# Patient Record
Sex: Female | Born: 1975 | Race: Black or African American | Hispanic: No | Marital: Single | Smoking: Never smoker
Health system: Southern US, Community
[De-identification: ages and names within clinical notes are randomized; demographics above are authoritative.]

## PROBLEM LIST (undated history)

## (undated) DIAGNOSIS — D649 Anemia, unspecified: Secondary | ICD-10-CM

## (undated) DIAGNOSIS — T7840XA Allergy, unspecified, initial encounter: Secondary | ICD-10-CM

## (undated) DIAGNOSIS — R87629 Unspecified abnormal cytological findings in specimens from vagina: Secondary | ICD-10-CM

## (undated) DIAGNOSIS — R87619 Unspecified abnormal cytological findings in specimens from cervix uteri: Secondary | ICD-10-CM

## (undated) DIAGNOSIS — IMO0002 Reserved for concepts with insufficient information to code with codable children: Secondary | ICD-10-CM

## (undated) HISTORY — DX: Unspecified abnormal cytological findings in specimens from cervix uteri: R87.619

## (undated) HISTORY — DX: Anemia, unspecified: D64.9

## (undated) HISTORY — PX: BIOPSY BREAST: PRO8

## (undated) HISTORY — DX: Reserved for concepts with insufficient information to code with codable children: IMO0002

## (undated) HISTORY — DX: Allergy, unspecified, initial encounter: T78.40XA

## (undated) HISTORY — PX: COLPOSCOPY: SHX161

## (undated) HISTORY — PX: WISDOM TOOTH EXTRACTION: SHX21

---

## 2005-08-24 ENCOUNTER — Emergency Department (HOSPITAL_COMMUNITY): Admission: EM | Admit: 2005-08-24 | Discharge: 2005-08-24 | Payer: Self-pay | Admitting: Emergency Medicine

## 2007-09-18 ENCOUNTER — Emergency Department: Payer: Self-pay | Admitting: Emergency Medicine

## 2011-05-08 NOTE — L&D Delivery Note (Signed)
Delivery Note At 9:54 AM a viable and healthy female was delivered via Vaginal, Spontaneous Delivery (Presentation: Left Occiput Anterior).  APGAR: 8, 9; weight pending.   Placenta status: Intact, Spontaneous.  Cord: 3 vessels with the following complications: None.    Anesthesia: None  Episiotomy: None Lacerations: 1st degree Suture Repair: 3.0 vicryl with Dr. Marice Potter Est. Blood Loss (mL): <300  Mom to postpartum.  Baby to nursery-stable. Mom plans to attempt to breastfeed; planning depo shot for birth control, appointment made 02/14/12 at 1:30 pm. Simone Curia 02/01/2012, 10:31 AM

## 2011-05-08 NOTE — L&D Delivery Note (Signed)
I was present for delivery and agree with above.  Tela Kotecki, MD 

## 2011-06-27 ENCOUNTER — Ambulatory Visit: Payer: BC Managed Care – PPO | Admitting: Gynecology

## 2011-06-27 VITALS — BP 132/61 | Wt 139.0 lb

## 2011-06-27 DIAGNOSIS — Z3480 Encounter for supervision of other normal pregnancy, unspecified trimester: Secondary | ICD-10-CM

## 2011-06-27 DIAGNOSIS — B373 Candidiasis of vulva and vagina: Secondary | ICD-10-CM

## 2011-06-27 LAB — POCT URINE PREGNANCY: Preg Test, Ur: POSITIVE

## 2011-06-27 MED ORDER — FLUCONAZOLE 150 MG PO TABS: 150.0000 mg | ORAL_TABLET | Freq: Once | ORAL | Status: AC

## 2011-06-28 ENCOUNTER — Encounter: Payer: Self-pay | Admitting: Family Medicine

## 2011-06-28 DIAGNOSIS — O9989 Other specified diseases and conditions complicating pregnancy, childbirth and the puerperium: Secondary | ICD-10-CM

## 2011-06-28 DIAGNOSIS — Z283 Underimmunization status: Secondary | ICD-10-CM | POA: Insufficient documentation

## 2011-06-28 DIAGNOSIS — Z2839 Other underimmunization status: Secondary | ICD-10-CM | POA: Insufficient documentation

## 2011-06-28 LAB — OBSTETRIC PANEL
Eosinophils Absolute: 0.1 10*3/uL (ref 0.0–0.7)
Eosinophils Relative: 2 % (ref 0–5)
HCT: 37.1 % (ref 36.0–46.0)
Hemoglobin: 11.5 g/dL — ABNORMAL LOW (ref 12.0–15.0)
Hepatitis B Surface Ag: NEGATIVE
Lymphocytes Relative: 24 % (ref 12–46)
MCHC: 31 g/dL (ref 30.0–36.0)
MCV: 83.7 fL (ref 78.0–100.0)
Monocytes Relative: 9 % (ref 3–12)
Platelets: 240 10*3/uL (ref 150–400)
Rh Type: POSITIVE
Rubella: 4.5 IU/mL

## 2011-06-28 LAB — HEPATITIS C ANTIBODY: HCV Ab: NEGATIVE

## 2011-06-29 LAB — CULTURE, URINE COMPREHENSIVE: Organism ID, Bacteria: NO GROWTH

## 2011-07-04 ENCOUNTER — Ambulatory Visit (HOSPITAL_COMMUNITY)
Admission: RE | Admit: 2011-07-04 | Discharge: 2011-07-04 | Disposition: A | Discharge status: Home / Self Care | Payer: BC Managed Care – PPO | Source: Ambulatory Visit | Attending: Family Medicine | Admitting: Family Medicine

## 2011-07-04 DIAGNOSIS — Z3689 Encounter for other specified antenatal screening: Secondary | ICD-10-CM | POA: Insufficient documentation

## 2011-07-04 DIAGNOSIS — O09529 Supervision of elderly multigravida, unspecified trimester: Secondary | ICD-10-CM | POA: Insufficient documentation

## 2011-07-04 DIAGNOSIS — Z3480 Encounter for supervision of other normal pregnancy, unspecified trimester: Secondary | ICD-10-CM

## 2011-07-09 ENCOUNTER — Ambulatory Visit (INDEPENDENT_AMBULATORY_CARE_PROVIDER_SITE_OTHER): Payer: BC Managed Care – PPO | Admitting: Obstetrics & Gynecology

## 2011-07-09 ENCOUNTER — Other Ambulatory Visit: Payer: Self-pay | Admitting: Obstetrics & Gynecology

## 2011-07-09 VITALS — BP 114/57 | Wt 138.0 lb

## 2011-07-09 DIAGNOSIS — Z23 Encounter for immunization: Secondary | ICD-10-CM

## 2011-07-09 DIAGNOSIS — Z369 Encounter for antenatal screening, unspecified: Secondary | ICD-10-CM

## 2011-07-09 DIAGNOSIS — Z3480 Encounter for supervision of other normal pregnancy, unspecified trimester: Secondary | ICD-10-CM

## 2011-07-09 DIAGNOSIS — Z113 Encounter for screening for infections with a predominantly sexual mode of transmission: Secondary | ICD-10-CM

## 2011-07-09 DIAGNOSIS — Z1272 Encounter for screening for malignant neoplasm of vagina: Secondary | ICD-10-CM

## 2011-07-09 NOTE — Progress Notes (Signed)
   Subjective:    Lisa Landry is a G2P1001 [redacted]w[redacted]d being seen today for her first obstetrical visit.  Her obstetrical history is significant for advanced maternal age. Patient does intend to breast feed. Pregnancy history fully reviewed.  Patient reports no complaints.  Filed Vitals:   07/09/11 1500  BP: 114/57  Weight: 138 lb (62.596 kg)    HISTORY: OB History    Grav Para Term Preterm Abortions TAB SAB Ect Mult Living   2 1 1       1      # Outc Date GA Lbr Len/2nd Wgt Sex Del Anes PTL Lv   1 TRM 1998 [redacted]w[redacted]d   F SVD None  Yes   2 CUR              Past Medical History  Diagnosis Date  . Abnormal Pap smear    Past Surgical History  Procedure Date  . Colposcopy     abnormal pap.  . Wisdom tooth extraction     x2   Family History  Problem Relation Age of Onset  . Hypothyroidism Mother   . Hypothyroidism Maternal Aunt      Exam    Uterine Size: size equals dates  Pelvic Exam:    Perineum: No Hemorrhoids   Vulva: normal   Vagina:  normal mucosa   pH:    Cervix: anteverted   Adnexa: normal adnexa   Bony Pelvis: android  System: Breast:  normal appearance, no masses or tenderness   Skin: normal coloration and turgor, no rashes    Neurologic: oriented   Extremities: normal strength, tone, and muscle mass   HEENT PERRLA   Mouth/Teeth mucous membranes moist, pharynx normal without lesions   Neck supple   Cardiovascular: regular rate and rhythm   Respiratory:  appears well, vitals normal, no respiratory distress, acyanotic, normal RR, ear and throat exam is normal, neck free of mass or lymphadenopathy, chest clear, no wheezing, crepitations, rhonchi, normal symmetric air entry   Abdomen: soft, non-tender; bowel sounds normal; no masses,  no organomegaly   Urinary: urethral meatus normal      Assessment:    Pregnancy: G2P1001 Patient Active Problem List  Diagnoses  . Rubella non-immune status, antepartum        Plan:     Initial labs  drawn. Prenatal vitamins. Problem list reviewed and updated. Genetic Screening discussed First Screen: requested.  Ultrasound discussed; fetal survey: requested.  Follow up in 4 weeks. % of  min visit spent on counseling and coordination of care.     Birch Farino C. 07/09/2011

## 2011-07-09 NOTE — Patient Instructions (Signed)
Pregnancy - First Trimester  During sexual intercourse, millions of sperm go into the vagina. Only 1 sperm will penetrate and fertilize the female egg while it is in the Fallopian tube. One week later, the fertilized egg implants into the wall of the uterus. An embryo begins to develop into a baby. At 6 to 8 weeks, the eyes and face are formed and the heartbeat can be seen on ultrasound. At the end of 12 weeks (first trimester), all the baby's organs are formed. Now that you are pregnant, you will want to do everything you can to have a healthy baby. Two of the most important things are to get good prenatal care and follow your caregiver's instructions. Prenatal care is all the medical care you receive before the baby's birth. It is given to prevent, find, and treat problems during the pregnancy and childbirth.  PRENATAL EXAMS   During prenatal visits, your weight, blood pressure and urine are checked. This is done to make sure you are healthy and progressing normally during the pregnancy.   A pregnant woman should gain 25 to 35 pounds during the pregnancy. However, if you are over weight or underweight, your caregiver will advise you regarding your weight.   Your caregiver will ask and answer questions for you.   Blood work, cervical cultures, other necessary tests and a Pap test are done during your prenatal exams. These tests are done to check on your health and the probable health of your baby. Tests are strongly recommended and done for HIV with your permission. This is the virus that causes AIDS. These tests are done because medications can be given to help prevent your baby from being born with this infection should you have been infected without knowing it. Blood work is also used to find out your blood type, previous infections and follow your blood levels (hemoglobin).   Low hemoglobin (anemia) is common during pregnancy. Iron and vitamins are given to help prevent this. Later in the pregnancy, blood  tests for diabetes will be done along with any other tests if any problems develop. You may need tests to make sure you and the baby are doing well.   You may need other tests to make sure you and the baby are doing well.  CHANGES DURING THE FIRST TRIMESTER (THE FIRST 3 MONTHS OF PREGNANCY)  Your body goes through many changes during pregnancy. They vary from person to person. Talk to your caregiver about changes you notice and are concerned about. Changes can include:   Your menstrual period stops.   The egg and sperm carry the genes that determine what you look like. Genes from you and your partner are forming a baby. The female genes determine whether the baby is a boy or a girl.   Your body increases in girth and you may feel bloated.   Feeling sick to your stomach (nauseous) and throwing up (vomiting). If the vomiting is uncontrollable, call your caregiver.   Your breasts will begin to enlarge and become tender.   Your nipples may stick out more and become darker.   The need to urinate more. Painful urination may mean you have a bladder infection.   Tiring easily.   Loss of appetite.   Cravings for certain kinds of food.   At first, you may gain or lose a couple of pounds.   You may have changes in your emotions from day to day (excited to be pregnant or concerned something may go wrong with   the pregnancy and baby).   You may have more vivid and strange dreams.  HOME CARE INSTRUCTIONS    It is very important to avoid all smoking, alcohol and un-prescribed drugs during your pregnancy. These affect the formation and growth of the baby. Avoid chemicals while pregnant to ensure the delivery of a healthy infant.   Start your prenatal visits by the 12th week of pregnancy. They are usually scheduled monthly at first, then more often in the last 2 months before delivery. Keep your caregiver's appointments. Follow your caregiver's instructions regarding medication use, blood and lab tests, exercise, and  diet.   During pregnancy, you are providing food for you and your baby. Eat regular, well-balanced meals. Choose foods such as meat, fish, milk and other low fat dairy products, vegetables, fruits, and whole-grain breads and cereals. Your caregiver will tell you of the ideal weight gain.   You can help morning sickness by keeping soda crackers at the bedside. Eat a couple before arising in the morning. You may want to use the crackers without salt on them.   Eating 4 to 5 small meals rather than 3 large meals a day also may help the nausea and vomiting.   Drinking liquids between meals instead of during meals also seems to help nausea and vomiting.   A physical sexual relationship may be continued throughout pregnancy if there are no other problems. Problems may be early (premature) leaking of amniotic fluid from the membranes, vaginal bleeding, or belly (abdominal) pain.   Exercise regularly if there are no restrictions. Check with your caregiver or physical therapist if you are unsure of the safety of some of your exercises. Greater weight gain will occur in the last 2 trimesters of pregnancy. Exercising will help:   Control your weight.   Keep you in shape.   Prepare you for labor and delivery.   Help you lose your pregnancy weight after you deliver your baby.   Wear a good support or jogging bra for breast tenderness during pregnancy. This may help if worn during sleep too.   Ask when prenatal classes are available. Begin classes when they are offered.   Do not use hot tubs, steam rooms or saunas.   Wear your seat belt when driving. This protects you and your baby if you are in an accident.   Avoid raw meat, uncooked cheese, cat litter boxes and soil used by cats throughout the pregnancy. These carry germs that can cause birth defects in the baby.   The first trimester is a good time to visit your dentist for your dental health. Getting your teeth cleaned is OK. Use a softer toothbrush and brush  gently during pregnancy.   Ask for help if you have financial, counseling or nutritional needs during pregnancy. Your caregiver will be able to offer counseling for these needs as well as refer you for other special needs.   Do not take any medications or herbs unless told by your caregiver.   Inform your caregiver if there is any mental or physical domestic violence.   Make a list of emergency phone numbers of family, friends, hospital, and police and fire departments.   Write down your questions. Take them to your prenatal visit.   Do not douche.   Do not cross your legs.   If you have to stand for long periods of time, rotate you feet or take small steps in a circle.   You may have more vaginal secretions that may   require a sanitary pad. Do not use tampons or scented sanitary pads.  MEDICATIONS AND DRUG USE IN PREGNANCY   Take prenatal vitamins as directed. The vitamin should contain 1 milligram of folic acid. Keep all vitamins out of reach of children. Only a couple vitamins or tablets containing iron may be fatal to a baby or young child when ingested.   Avoid use of all medications, including herbs, over-the-counter medications, not prescribed or suggested by your caregiver. Only take over-the-counter or prescription medicines for pain, discomfort, or fever as directed by your caregiver. Do not use aspirin, ibuprofen, or naproxen unless directed by your caregiver.   Let your caregiver also know about herbs you may be using.   Alcohol is related to a number of birth defects. This includes fetal alcohol syndrome. All alcohol, in any form, should be avoided completely. Smoking will cause low birth rate and premature babies.   Street or illegal drugs are very harmful to the baby. They are absolutely forbidden. A baby born to an addicted mother will be addicted at birth. The baby will go through the same withdrawal an adult does.   Let your caregiver know about any medications that you have to take  and for what reason you take them.  MISCARRIAGE IS COMMON DURING PREGNANCY  A miscarriage does not mean you did something wrong. It is not a reason to worry about getting pregnant again. Your caregiver will help you with questions you may have. If you have a miscarriage, you may need minor surgery.  SEEK MEDICAL CARE IF:   You have any concerns or worries during your pregnancy. It is better to call with your questions if you feel they cannot wait, rather than worry about them.  SEEK IMMEDIATE MEDICAL CARE IF:    An unexplained oral temperature above 102 F (38.9 C) develops, or as your caregiver suggests.   You have leaking of fluid from the vagina (birth canal). If leaking membranes are suspected, take your temperature and inform your caregiver of this when you call.   There is vaginal spotting or bleeding. Notify your caregiver of the amount and how many pads are used.   You develop a bad smelling vaginal discharge with a change in the color.   You continue to feel sick to your stomach (nauseated) and have no relief from remedies suggested. You vomit blood or coffee ground-like materials.   You lose more than 2 pounds of weight in 1 week.   You gain more than 2 pounds of weight in 1 week and you notice swelling of your face, hands, feet, or legs.   You gain 5 pounds or more in 1 week (even if you do not have swelling of your hands, face, legs, or feet).   You get exposed to German measles and have never had them.   You are exposed to fifth disease or chickenpox.   You develop belly (abdominal) pain. Round ligament discomfort is a common non-cancerous (benign) cause of abdominal pain in pregnancy. Your caregiver still must evaluate this.   You develop headache, fever, diarrhea, pain with urination, or shortness of breath.   You fall or are in a car accident or have any kind of trauma.   There is mental or physical violence in your home.  Document Released: 04/17/2001 Document Revised: 04/12/2011  Document Reviewed: 10/19/2008  [employer] Patient Information 2012 [employer].

## 2011-07-24 ENCOUNTER — Other Ambulatory Visit: Payer: Self-pay

## 2011-07-24 ENCOUNTER — Ambulatory Visit (HOSPITAL_COMMUNITY)
Admission: RE | Admit: 2011-07-24 | Discharge: 2011-07-24 | Disposition: A | Discharge status: Home / Self Care | Payer: BC Managed Care – PPO | Source: Ambulatory Visit | Attending: Obstetrics & Gynecology | Admitting: Obstetrics & Gynecology

## 2011-07-24 DIAGNOSIS — O09529 Supervision of elderly multigravida, unspecified trimester: Secondary | ICD-10-CM | POA: Insufficient documentation

## 2011-07-24 DIAGNOSIS — Z369 Encounter for antenatal screening, unspecified: Secondary | ICD-10-CM

## 2011-08-07 ENCOUNTER — Ambulatory Visit (INDEPENDENT_AMBULATORY_CARE_PROVIDER_SITE_OTHER): Payer: BC Managed Care – PPO | Admitting: Obstetrics & Gynecology

## 2011-08-07 DIAGNOSIS — Z3480 Encounter for supervision of other normal pregnancy, unspecified trimester: Secondary | ICD-10-CM

## 2011-08-07 NOTE — Patient Instructions (Signed)
Breastfeeding BENEFITS OF BREASTFEEDING For the baby  The first milk (colostrum) helps the baby's digestive system function better.   There are antibodies from the mother in the milk that help the baby fight off infections.   The baby has a lower incidence of asthma, allergies, and SIDS (sudden infant death syndrome).   The nutrients in breast milk are better than formulas for the baby and helps the baby's brain grow better.   Babies who breastfeed have less gas, colic, and constipation.  For the mother  Breastfeeding helps develop a very special bond between mother and baby.   It is more convenient, always available at the correct temperature and cheaper than formula feeding.   It burns calories in the mother and helps with losing weight that was gained during pregnancy.   It makes the uterus contract back down to normal size faster and slows bleeding following delivery.   Breastfeeding mothers have a lower risk of developing breast cancer.  NURSE FREQUENTLY  A healthy, full-term baby may breastfeed as often as every hour or space his or her feedings to every 3 hours.   How often to nurse will vary from baby to baby. Watch your baby for signs of hunger, not the clock.   Nurse as often as the baby requests, or when you feel the need to reduce the fullness of your breasts.   Awaken the baby if it has been 3 to 4 hours since the last feeding.   Frequent feeding will help the mother make more milk and will prevent problems like sore nipples and engorgement of the breasts.  BABY'S POSITION AT THE BREAST  Whether lying down or sitting, be sure that the baby's tummy is facing your tummy.   Support the breast with 4 fingers underneath the breast and the thumb above. Make sure your fingers are well away from the nipple and baby's mouth.   Stroke the baby's lips and cheek closest to the breast gently with your finger or nipple.   When the baby's mouth is open wide enough, place all  of your nipple and as much of the dark area around the nipple as possible into your baby's mouth.   Pull the baby in close so the tip of the nose and the baby's cheeks touch the breast during the feeding.  FEEDINGS  The length of each feeding varies from baby to baby and from feeding to feeding.   The baby must suck about 2 to 3 minutes for your milk to get to him or her. This is called a "let down." For this reason, allow the baby to feed on each breast as long as he or she wants. Your baby will end the feeding when he or she has received the right balance of nutrients.   To break the suction, put your finger into the corner of the baby's mouth and slide it between his or her gums before removing your breast from his or her mouth. This will help prevent sore nipples.  REDUCING BREAST ENGORGEMENT  In the first week after your baby is born, you may experience signs of breast engorgement. When breasts are engorged, they feel heavy, warm, full, and may be tender to the touch. You can reduce engorgement if you:   Nurse frequently, every 2 to 3 hours. Mothers who breastfeed early and often have fewer problems with engorgement.   Place light ice packs on your breasts between feedings. This reduces swelling. Wrap the ice packs in a   lightweight towel to protect your skin.   Apply moist hot packs to your breast for 5 to 10 minutes before each feeding. This increases circulation and helps the milk flow.   Gently massage your breast before and during the feeding.   Make sure that the baby empties at least one breast at every feeding before switching sides.   Use a breast pump to empty the breasts if your baby is sleepy or not nursing well. You may also want to pump if you are returning to work or or you feel you are getting engorged.   Avoid bottle feeds, pacifiers or supplemental feedings of water or juice in place of breastfeeding.   Be sure the baby is latched on and positioned properly while  breastfeeding.   Prevent fatigue, stress, and anemia.   Wear a supportive bra, avoiding underwire styles.   Eat a balanced diet with enough fluids.  If you follow these suggestions, your engorgement should improve in 24 to 48 hours. If you are still experiencing difficulty, call your lactation consultant or caregiver. IS MY BABY GETTING ENOUGH MILK? Sometimes, mothers worry about whether their babies are getting enough milk. You can be assured that your baby is getting enough milk if:  The baby is actively sucking and you hear swallowing.   The baby nurses at least 8 to 12 times in a 24 hour time period. Nurse your baby until he or she unlatches or falls asleep at the first breast (at least 10 to 20 minutes), then offer the second side.   The baby is wetting 5 to 6 disposable diapers (6 to 8 cloth diapers) in a 24 hour period by 5 to 6 days of age.   The baby is having at least 2 to 3 stools every 24 hours for the first few months. Breast milk is all the food your baby needs. It is not necessary for your baby to have water or formula. In fact, to help your breasts make more milk, it is best not to give your baby supplemental feedings during the early weeks.   The stool should be soft and yellow.   The baby should gain 4 to 7 ounces per week after he is 4 days old.  TAKE CARE OF YOURSELF Take care of your breasts by:  Bathing or showering daily.   Avoiding the use of soaps on your nipples.   Start feedings on your left breast at one feeding and on your right breast at the next feeding.   You will notice an increase in your milk supply 2 to 5 days after delivery. You may feel some discomfort from engorgement, which makes your breasts very firm and often tender. Engorgement "peaks" out within 24 to 48 hours. In the meantime, apply warm moist towels to your breasts for 5 to 10 minutes before feeding. Gentle massage and expression of some milk before feeding will soften your breasts, making  it easier for your baby to latch on. Wear a well fitting nursing bra and air dry your nipples for 10 to 15 minutes after each feeding.   Only use cotton bra pads.   Only use pure lanolin on your nipples after nursing. You do not need to wash it off before nursing.  Take care of yourself by:   Eating well-balanced meals and nutritious snacks.   Drinking milk, fruit juice, and water to satisfy your thirst (about 8 glasses a day).   Getting plenty of rest.   Increasing calcium in   your diet (1200 mg a day).   Avoiding foods that you notice affect the baby in a bad way.  SEEK MEDICAL CARE IF:   You have any questions or difficulty with breastfeeding.   You need help.   You have a hard, red, sore area on your breast, accompanied by a fever of 100.5 F (38.1 C) or more.   Your baby is too sleepy to eat well or is having trouble sleeping.   Your baby is wetting less than 6 diapers per day, by 5 days of age.   Your baby's skin or white part of his or her eyes is more yellow than it was in the hospital.   You feel depressed.  Document Released: 04/23/2005 Document Revised: 04/12/2011 Document Reviewed: 12/06/2008 [employer] Patient Information 2012 [employer]. 

## 2011-08-07 NOTE — Progress Notes (Signed)
Spotting this weekend now resolved. No pain or discharge. First trimester screen and NT normal. Anatomy scan in 4 weeks.

## 2011-09-03 ENCOUNTER — Other Ambulatory Visit: Payer: Self-pay | Admitting: Obstetrics & Gynecology

## 2011-09-03 DIAGNOSIS — O09529 Supervision of elderly multigravida, unspecified trimester: Secondary | ICD-10-CM

## 2011-09-03 DIAGNOSIS — Z3689 Encounter for other specified antenatal screening: Secondary | ICD-10-CM

## 2011-09-04 ENCOUNTER — Ambulatory Visit (INDEPENDENT_AMBULATORY_CARE_PROVIDER_SITE_OTHER): Payer: BC Managed Care – PPO | Admitting: Obstetrics and Gynecology

## 2011-09-04 ENCOUNTER — Ambulatory Visit (HOSPITAL_COMMUNITY)
Admission: RE | Admit: 2011-09-04 | Discharge: 2011-09-04 | Disposition: A | Discharge status: Home / Self Care | Payer: BC Managed Care – PPO | Source: Ambulatory Visit | Attending: Obstetrics & Gynecology | Admitting: Obstetrics & Gynecology

## 2011-09-04 VITALS — BP 133/85 | Wt 142.0 lb

## 2011-09-04 DIAGNOSIS — IMO0002 Reserved for concepts with insufficient information to code with codable children: Secondary | ICD-10-CM

## 2011-09-04 DIAGNOSIS — O09529 Supervision of elderly multigravida, unspecified trimester: Secondary | ICD-10-CM | POA: Insufficient documentation

## 2011-09-04 DIAGNOSIS — Z1389 Encounter for screening for other disorder: Secondary | ICD-10-CM | POA: Insufficient documentation

## 2011-09-04 DIAGNOSIS — Z3480 Encounter for supervision of other normal pregnancy, unspecified trimester: Secondary | ICD-10-CM | POA: Insufficient documentation

## 2011-09-04 DIAGNOSIS — O358XX0 Maternal care for other (suspected) fetal abnormality and damage, not applicable or unspecified: Secondary | ICD-10-CM | POA: Insufficient documentation

## 2011-09-04 DIAGNOSIS — Z363 Encounter for antenatal screening for malformations: Secondary | ICD-10-CM | POA: Insufficient documentation

## 2011-09-04 DIAGNOSIS — Z3689 Encounter for other specified antenatal screening: Secondary | ICD-10-CM

## 2011-09-04 NOTE — Progress Notes (Signed)
Patient doing well without complaints. F/U anatomy scan report which is scheduled for later today.

## 2011-09-07 ENCOUNTER — Encounter: Payer: Self-pay | Admitting: Obstetrics & Gynecology

## 2011-10-02 ENCOUNTER — Encounter: Payer: Self-pay | Admitting: Obstetrics & Gynecology

## 2011-10-02 ENCOUNTER — Ambulatory Visit (INDEPENDENT_AMBULATORY_CARE_PROVIDER_SITE_OTHER): Payer: BC Managed Care – PPO | Admitting: Obstetrics & Gynecology

## 2011-10-02 VITALS — BP 137/72 | Wt 152.0 lb

## 2011-10-02 DIAGNOSIS — Z3480 Encounter for supervision of other normal pregnancy, unspecified trimester: Secondary | ICD-10-CM

## 2011-10-02 NOTE — Progress Notes (Signed)
Routine visit. Good FM. No problems.  

## 2011-10-29 ENCOUNTER — Encounter: Payer: Self-pay | Admitting: Family Medicine

## 2011-10-29 ENCOUNTER — Ambulatory Visit (INDEPENDENT_AMBULATORY_CARE_PROVIDER_SITE_OTHER): Payer: BC Managed Care – PPO | Admitting: Family Medicine

## 2011-10-29 VITALS — BP 117/54 | Wt 153.0 lb

## 2011-10-29 DIAGNOSIS — Z3480 Encounter for supervision of other normal pregnancy, unspecified trimester: Secondary | ICD-10-CM

## 2011-10-29 LAB — CBC
MCH: 25.9 pg — ABNORMAL LOW (ref 26.0–34.0)
MCHC: 32 g/dL (ref 30.0–36.0)
MCV: 81 fL (ref 78.0–100.0)
RBC: 3.94 MIL/uL (ref 3.87–5.11)

## 2011-10-29 NOTE — Patient Instructions (Addendum)
Pregnancy - Third Trimester The third trimester of pregnancy (the last 3 months) is a period of the most rapid growth for you and your baby. The baby approaches a length of 20 inches and a weight of 6 to 10 pounds. The baby is adding on fat and getting ready for life outside your body. While inside, babies have periods of sleeping and waking, suck their thumbs, and hiccups. You can often feel small contractions of the uterus. This is false labor. It is also called Braxton-Hicks contractions. This is like a practice for labor. The usual problems in this stage of pregnancy include more difficulty breathing, swelling of the hands and feet from water retention, and having to urinate more often because of the uterus and baby pressing on your bladder.  PRENATAL EXAMS  Blood work may continue to be done during prenatal exams. These tests are done to check on your health and the probable health of your baby. Blood work is used to follow your blood levels (hemoglobin). Anemia (low hemoglobin) is common during pregnancy. Iron and vitamins are given to help prevent this. You may also continue to be checked for diabetes. Some of the past blood tests may be done again.   The size of the uterus is measured during each visit. This makes sure your baby is growing properly according to your pregnancy dates.   Your blood pressure is checked every prenatal visit. This is to make sure you are not getting toxemia.   Your urine is checked every prenatal visit for infection, diabetes and protein.   Your weight is checked at each visit. This is done to make sure gains are happening at the suggested rate and that you and your baby are growing normally.   Sometimes, an ultrasound is performed to confirm the position and the proper growth and development of the baby. This is a test done that bounces harmless sound waves off the baby so your caregiver can more accurately determine due dates.   Discuss the type of pain  medication and anesthesia you will have during your labor and delivery.   Discuss the possibility and anesthesia if a Cesarean Section might be necessary.   Inform your caregiver if there is any mental or physical violence at home.  Sometimes, a specialized non-stress test, contraction stress test and biophysical profile are done to make sure the baby is not having a problem. Checking the amniotic fluid surrounding the baby is called an amniocentesis. The amniotic fluid is removed by sticking a needle into the belly (abdomen). This is sometimes done near the end of pregnancy if an early delivery is required. In this case, it is done to help make sure the baby's lungs are mature enough for the baby to live outside of the womb. If the lungs are not mature and it is unsafe to deliver the baby, an injection of cortisone medication is given to the mother 1 to 2 days before the delivery. This helps the baby's lungs mature and makes it safer to deliver the baby. CHANGES OCCURING IN THE THIRD TRIMESTER OF PREGNANCY Your body goes through many changes during pregnancy. They vary from person to person. Talk to your caregiver about changes you notice and are concerned about.  During the last trimester, you have probably had an increase in your appetite. It is normal to have cravings for certain foods. This varies from person to person and pregnancy to pregnancy.   You may begin to get stretch marks on your hips,   abdomen, and breasts. These are normal changes in the body during pregnancy. There are no exercises or medications to take which prevent this change.   Constipation may be treated with a stool softener or adding bulk to your diet. Drinking lots of fluids, fiber in vegetables, fruits, and whole grains are helpful.   Exercising is also helpful. If you have been very active up until your pregnancy, most of these activities can be continued during your pregnancy. If you have been less active, it is helpful  to start an exercise program such as walking. Consult your caregiver before starting exercise programs.   Avoid all smoking, alcohol, un-prescribed drugs, herbs and "street drugs" during your pregnancy. These chemicals affect the formation and growth of the baby. Avoid chemicals throughout the pregnancy to ensure the delivery of a healthy infant.   Backache, varicose veins and hemorrhoids may develop or get worse.   You will tire more easily in the third trimester, which is normal.   The baby's movements may be stronger and more often.   You may become short of breath easily.   Your belly button may stick out.   A yellow discharge may leak from your breasts called colostrum.   You may have a bloody mucus discharge. This usually occurs a few days to a week before labor begins.  HOME CARE INSTRUCTIONS   Keep your caregiver's appointments. Follow your caregiver's instructions regarding medication use, exercise, and diet.   During pregnancy, you are providing food for you and your baby. Continue to eat regular, well-balanced meals. Choose foods such as meat, fish, milk and other low fat dairy products, vegetables, fruits, and whole-grain breads and cereals. Your caregiver will tell you of the ideal weight gain.   A physical sexual relationship may be continued throughout pregnancy if there are no other problems such as early (premature) leaking of amniotic fluid from the membranes, vaginal bleeding, or belly (abdominal) pain.   Exercise regularly if there are no restrictions. Check with your caregiver if you are unsure of the safety of your exercises. Greater weight gain will occur in the last 2 trimesters of pregnancy. Exercising helps:   Control your weight.   Get you in shape for labor and delivery.   You lose weight after you deliver.   Rest a lot with legs elevated, or as needed for leg cramps or low back pain.   Wear a good support or jogging bra for breast tenderness during  pregnancy. This may help if worn during sleep. Pads or tissues may be used in the bra if you are leaking colostrum.   Do not use hot tubs, steam rooms, or saunas.   Wear your seat belt when driving. This protects you and your baby if you are in an accident.   Avoid raw meat, cat litter boxes and soil used by cats. These carry germs that can cause birth defects in the baby.   It is easier to loose urine during pregnancy. Tightening up and strengthening the pelvic muscles will help with this problem. You can practice stopping your urination while you are going to the bathroom. These are the same muscles you need to strengthen. It is also the muscles you would use if you were trying to stop from passing gas. You can practice tightening these muscles up 10 times a set and repeating this about 3 times per day. Once you know what muscles to tighten up, do not perform these exercises during urination. It is more likely   to cause an infection by backing up the urine.   Ask for help if you have financial, counseling or nutritional needs during pregnancy. Your caregiver will be able to offer counseling for these needs as well as refer you for other special needs.   Make a list of emergency phone numbers and have them available.   Plan on getting help from family or friends when you go home from the hospital.   Make a trial run to the hospital.   Take prenatal classes with the father to understand, practice and ask questions about the labor and delivery.   Prepare the baby's room/nursery.   Do not travel out of the city unless it is absolutely necessary and with the advice of your caregiver.   Wear only low or no heal shoes to have better balance and prevent falling.  MEDICATIONS AND DRUG USE IN PREGNANCY  Take prenatal vitamins as directed. The vitamin should contain 1 milligram of folic acid. Keep all vitamins out of reach of children. Only a couple vitamins or tablets containing iron may be fatal  to a baby or young child when ingested.   Avoid use of all medications, including herbs, over-the-counter medications, not prescribed or suggested by your caregiver. Only take over-the-counter or prescription medicines for pain, discomfort, or fever as directed by your caregiver. Do not use aspirin, ibuprofen (Motrin, Advil, Nuprin) or naproxen (Aleve) unless OK'd by your caregiver.   Let your caregiver also know about herbs you may be using.   Alcohol is related to a number of birth defects. This includes fetal alcohol syndrome. All alcohol, in any form, should be avoided completely. Smoking will cause low birth rate and premature babies.   Street/illegal drugs are very harmful to the baby. They are absolutely forbidden. A baby born to an addicted mother will be addicted at birth. The baby will go through the same withdrawal an adult does.  SEEK MEDICAL CARE IF: You have any concerns or worries during your pregnancy. It is better to call with your questions if you feel they cannot wait, rather than worry about them. DECISIONS ABOUT CIRCUMCISION You may or may not know the sex of your baby. If you know your baby is a boy, it may be time to think about circumcision. Circumcision is the removal of the foreskin of the penis. This is the skin that covers the sensitive end of the penis. There is no proven medical need for this. Often this decision is made on what is popular at the time or based upon religious beliefs and social issues. You can discuss these issues with your caregiver or pediatrician. SEEK IMMEDIATE MEDICAL CARE IF:   An unexplained oral temperature above 102 F (38.9 C) develops, or as your caregiver suggests.   You have leaking of fluid from the vagina (birth canal). If leaking membranes are suspected, take your temperature and tell your caregiver of this when you call.   There is vaginal spotting, bleeding or passing clots. Tell your caregiver of the amount and how many pads are  used.   You develop a bad smelling vaginal discharge with a change in the color from clear to white.   You develop vomiting that lasts more than 24 hours.   You develop chills or fever.   You develop shortness of breath.   You develop burning on urination.   You loose more than 2 pounds of weight or gain more than 2 pounds of weight or as suggested by your   caregiver.   You notice sudden swelling of your face, hands, and feet or legs.   You develop belly (abdominal) pain. Round ligament discomfort is a common non-cancerous (benign) cause of abdominal pain in pregnancy. Your caregiver still must evaluate you.   You develop a severe headache that does not go away.   You develop visual problems, blurred or double vision.   If you have not felt your baby move for more than 1 hour. If you think the baby is not moving as much as usual, eat something with sugar in it and lie down on your left side for an hour. The baby should move at least 4 to 5 times per hour. Call right away if your baby moves less than that.   You fall, are in a car accident or any kind of trauma.   There is mental or physical violence at home.  Document Released: 04/17/2001 Document Revised: 04/12/2011 Document Reviewed: 10/20/2008 [employer] Patient Information 2012 [employer]. Breastfeeding BENEFITS OF BREASTFEEDING For the baby  The first milk (colostrum) helps the baby's digestive system function better.   There are antibodies from the mother in the milk that help the baby fight off infections.   The baby has a lower incidence of asthma, allergies, and SIDS (sudden infant death syndrome).   The nutrients in breast milk are better than formulas for the baby and helps the baby's brain grow better.   Babies who breastfeed have less gas, colic, and constipation.  For the mother  Breastfeeding helps develop a very special bond between mother and baby.   It is more convenient, always available at the  correct temperature and cheaper than formula feeding.   It burns calories in the mother and helps with losing weight that was gained during pregnancy.   It makes the uterus contract back down to normal size faster and slows bleeding following delivery.   Breastfeeding mothers have a lower risk of developing breast cancer.  NURSE FREQUENTLY  A healthy, full-term baby may breastfeed as often as every hour or space his or her feedings to every 3 hours.   How often to nurse will vary from baby to baby. Watch your baby for signs of hunger, not the clock.   Nurse as often as the baby requests, or when you feel the need to reduce the fullness of your breasts.   Awaken the baby if it has been 3 to 4 hours since the last feeding.   Frequent feeding will help the mother make more milk and will prevent problems like sore nipples and engorgement of the breasts.  BABY'S POSITION AT THE BREAST  Whether lying down or sitting, be sure that the baby's tummy is facing your tummy.   Support the breast with 4 fingers underneath the breast and the thumb above. Make sure your fingers are well away from the nipple and baby's mouth.   Stroke the baby's lips and cheek closest to the breast gently with your finger or nipple.   When the baby's mouth is open wide enough, place all of your nipple and as much of the dark area around the nipple as possible into your baby's mouth.   Pull the baby in close so the tip of the nose and the baby's cheeks touch the breast during the feeding.  FEEDINGS  The length of each feeding varies from baby to baby and from feeding to feeding.   The baby must suck about 2 to 3 minutes for your milk   to get to him or her. This is called a "let down." For this reason, allow the baby to feed on each breast as long as he or she wants. Your baby will end the feeding when he or she has received the right balance of nutrients.   To break the suction, put your finger into the corner of the  baby's mouth and slide it between his or her gums before removing your breast from his or her mouth. This will help prevent sore nipples.  REDUCING BREAST ENGORGEMENT  In the first week after your baby is born, you may experience signs of breast engorgement. When breasts are engorged, they feel heavy, warm, full, and may be tender to the touch. You can reduce engorgement if you:   Nurse frequently, every 2 to 3 hours. Mothers who breastfeed early and often have fewer problems with engorgement.   Place light ice packs on your breasts between feedings. This reduces swelling. Wrap the ice packs in a lightweight towel to protect your skin.   Apply moist hot packs to your breast for 5 to 10 minutes before each feeding. This increases circulation and helps the milk flow.   Gently massage your breast before and during the feeding.   Make sure that the baby empties at least one breast at every feeding before switching sides.   Use a breast pump to empty the breasts if your baby is sleepy or not nursing well. You may also want to pump if you are returning to work or or you feel you are getting engorged.   Avoid bottle feeds, pacifiers or supplemental feedings of water or juice in place of breastfeeding.   Be sure the baby is latched on and positioned properly while breastfeeding.   Prevent fatigue, stress, and anemia.   Wear a supportive bra, avoiding underwire styles.   Eat a balanced diet with enough fluids.  If you follow these suggestions, your engorgement should improve in 24 to 48 hours. If you are still experiencing difficulty, call your lactation consultant or caregiver. IS MY BABY GETTING ENOUGH MILK? Sometimes, mothers worry about whether their babies are getting enough milk. You can be assured that your baby is getting enough milk if:  The baby is actively sucking and you hear swallowing.   The baby nurses at least 8 to 12 times in a 24 hour time period. Nurse your baby until he or  she unlatches or falls asleep at the first breast (at least 10 to 20 minutes), then offer the second side.   The baby is wetting 5 to 6 disposable diapers (6 to 8 cloth diapers) in a 24 hour period by 5 to 6 days of age.   The baby is having at least 2 to 3 stools every 24 hours for the first few months. Breast milk is all the food your baby needs. It is not necessary for your baby to have water or formula. In fact, to help your breasts make more milk, it is best not to give your baby supplemental feedings during the early weeks.   The stool should be soft and yellow.   The baby should gain 4 to 7 ounces per week after he is 4 days old.  TAKE CARE OF YOURSELF Take care of your breasts by:  Bathing or showering daily.   Avoiding the use of soaps on your nipples.   Start feedings on your left breast at one feeding and on your right breast at the next feeding.     You will notice an increase in your milk supply 2 to 5 days after delivery. You may feel some discomfort from engorgement, which makes your breasts very firm and often tender. Engorgement "peaks" out within 24 to 48 hours. In the meantime, apply warm moist towels to your breasts for 5 to 10 minutes before feeding. Gentle massage and expression of some milk before feeding will soften your breasts, making it easier for your baby to latch on. Wear a well fitting nursing bra and air dry your nipples for 10 to 15 minutes after each feeding.   Only use cotton bra pads.   Only use pure lanolin on your nipples after nursing. You do not need to wash it off before nursing.  Take care of yourself by:   Eating well-balanced meals and nutritious snacks.   Drinking milk, fruit juice, and water to satisfy your thirst (about 8 glasses a day).   Getting plenty of rest.   Increasing calcium in your diet (1200 mg a day).   Avoiding foods that you notice affect the baby in a bad way.  SEEK MEDICAL CARE IF:   You have any questions or difficulty  with breastfeeding.   You need help.   You have a hard, red, sore area on your breast, accompanied by a fever of 100.5 F (38.1 C) or more.   Your baby is too sleepy to eat well or is having trouble sleeping.   Your baby is wetting less than 6 diapers per day, by 5 days of age.   Your baby's skin or white part of his or her eyes is more yellow than it was in the hospital.   You feel depressed.  Document Released: 04/23/2005 Document Revised: 04/12/2011 Document Reviewed: 12/06/2008 [employer] Patient Information 2012 [employer]. 

## 2011-10-29 NOTE — Progress Notes (Signed)
Routine visit doing well.  28 wk labs today.  Needs TDaP

## 2011-10-30 LAB — RPR

## 2011-11-05 ENCOUNTER — Encounter: Payer: Self-pay | Admitting: Family Medicine

## 2011-11-26 ENCOUNTER — Encounter: Payer: Self-pay | Admitting: Obstetrics & Gynecology

## 2011-11-26 ENCOUNTER — Ambulatory Visit (INDEPENDENT_AMBULATORY_CARE_PROVIDER_SITE_OTHER): Payer: BC Managed Care – PPO | Admitting: Obstetrics & Gynecology

## 2011-11-26 VITALS — BP 127/74 | Ht 60.0 in | Wt 158.0 lb

## 2011-11-26 DIAGNOSIS — Z3480 Encounter for supervision of other normal pregnancy, unspecified trimester: Secondary | ICD-10-CM

## 2011-11-26 DIAGNOSIS — O09529 Supervision of elderly multigravida, unspecified trimester: Secondary | ICD-10-CM

## 2011-11-26 DIAGNOSIS — Z23 Encounter for immunization: Secondary | ICD-10-CM

## 2011-11-26 DIAGNOSIS — IMO0002 Reserved for concepts with insufficient information to code with codable children: Secondary | ICD-10-CM

## 2011-11-26 NOTE — Progress Notes (Signed)
Routine visit, doing well.

## 2011-11-26 NOTE — Progress Notes (Signed)
Routine visit. No problems, still. Good FM. TDaP today. Considering depo provera for birth control.

## 2011-12-17 ENCOUNTER — Encounter: Payer: BC Managed Care – PPO | Admitting: Obstetrics & Gynecology

## 2011-12-25 ENCOUNTER — Encounter: Payer: Self-pay | Admitting: Family Medicine

## 2011-12-25 ENCOUNTER — Ambulatory Visit (INDEPENDENT_AMBULATORY_CARE_PROVIDER_SITE_OTHER): Payer: BC Managed Care – PPO | Admitting: Family Medicine

## 2011-12-25 DIAGNOSIS — Z3480 Encounter for supervision of other normal pregnancy, unspecified trimester: Secondary | ICD-10-CM

## 2011-12-25 NOTE — Patient Instructions (Signed)

## 2011-12-25 NOTE — Progress Notes (Signed)
Doing well--excellent fetal movement, no concerns.

## 2012-01-08 ENCOUNTER — Ambulatory Visit (INDEPENDENT_AMBULATORY_CARE_PROVIDER_SITE_OTHER): Payer: BC Managed Care – PPO | Admitting: Obstetrics & Gynecology

## 2012-01-08 ENCOUNTER — Encounter: Payer: Self-pay | Admitting: Obstetrics & Gynecology

## 2012-01-08 VITALS — BP 122/66 | Wt 163.0 lb

## 2012-01-08 DIAGNOSIS — Z3480 Encounter for supervision of other normal pregnancy, unspecified trimester: Secondary | ICD-10-CM

## 2012-01-08 DIAGNOSIS — IMO0002 Reserved for concepts with insufficient information to code with codable children: Secondary | ICD-10-CM

## 2012-01-08 DIAGNOSIS — O09529 Supervision of elderly multigravida, unspecified trimester: Secondary | ICD-10-CM

## 2012-01-08 NOTE — Progress Notes (Signed)
Routine visit. No problems. Cervical cultures today. Labor precautions.

## 2012-01-09 LAB — GC/CHLAMYDIA PROBE AMP, GENITAL: Chlamydia, DNA Probe: NEGATIVE

## 2012-01-15 ENCOUNTER — Ambulatory Visit (INDEPENDENT_AMBULATORY_CARE_PROVIDER_SITE_OTHER): Payer: BC Managed Care – PPO | Admitting: Obstetrics & Gynecology

## 2012-01-15 VITALS — BP 128/72 | Wt 164.0 lb

## 2012-01-15 DIAGNOSIS — O09529 Supervision of elderly multigravida, unspecified trimester: Secondary | ICD-10-CM

## 2012-01-15 DIAGNOSIS — Z2839 Other underimmunization status: Secondary | ICD-10-CM

## 2012-01-15 DIAGNOSIS — IMO0002 Reserved for concepts with insufficient information to code with codable children: Secondary | ICD-10-CM

## 2012-01-15 DIAGNOSIS — O9989 Other specified diseases and conditions complicating pregnancy, childbirth and the puerperium: Secondary | ICD-10-CM

## 2012-01-15 DIAGNOSIS — Z283 Underimmunization status: Secondary | ICD-10-CM

## 2012-01-15 DIAGNOSIS — Z3480 Encounter for supervision of other normal pregnancy, unspecified trimester: Secondary | ICD-10-CM

## 2012-01-15 NOTE — Patient Instructions (Signed)
Return to clinic for any obstetric concerns or go to [facility] for evaluation  

## 2012-01-15 NOTE — Addendum Note (Signed)
Addended by: Pennie Banter on: 01/15/2012 04:54 PM   Modules accepted: Orders

## 2012-01-15 NOTE — Progress Notes (Signed)
GBS repeated today, cannot find result from last encounter. No other complaints or concerns.  Fetal movement and labor precautions reviewed.

## 2012-01-22 ENCOUNTER — Ambulatory Visit (INDEPENDENT_AMBULATORY_CARE_PROVIDER_SITE_OTHER): Payer: BC Managed Care – PPO | Admitting: Obstetrics & Gynecology

## 2012-01-22 VITALS — BP 118/72 | Wt 163.0 lb

## 2012-01-22 DIAGNOSIS — IMO0002 Reserved for concepts with insufficient information to code with codable children: Secondary | ICD-10-CM

## 2012-01-22 DIAGNOSIS — Z283 Underimmunization status: Secondary | ICD-10-CM

## 2012-01-22 DIAGNOSIS — Z2839 Other underimmunization status: Secondary | ICD-10-CM

## 2012-01-22 DIAGNOSIS — O9989 Other specified diseases and conditions complicating pregnancy, childbirth and the puerperium: Secondary | ICD-10-CM

## 2012-01-22 DIAGNOSIS — Z3480 Encounter for supervision of other normal pregnancy, unspecified trimester: Secondary | ICD-10-CM

## 2012-01-22 DIAGNOSIS — O09529 Supervision of elderly multigravida, unspecified trimester: Secondary | ICD-10-CM

## 2012-01-22 NOTE — Progress Notes (Signed)
GBS negative. No other complaints or concerns.  Fetal movement and labor precautions reviewed. 

## 2012-01-22 NOTE — Patient Instructions (Signed)
Return to clinic for any obstetric concerns or go to [facility] for evaluation  

## 2012-01-28 ENCOUNTER — Ambulatory Visit (INDEPENDENT_AMBULATORY_CARE_PROVIDER_SITE_OTHER): Payer: BC Managed Care – PPO | Admitting: Obstetrics and Gynecology

## 2012-01-28 VITALS — BP 130/82 | Wt 165.0 lb

## 2012-01-28 DIAGNOSIS — Z2839 Other underimmunization status: Secondary | ICD-10-CM

## 2012-01-28 DIAGNOSIS — Z3480 Encounter for supervision of other normal pregnancy, unspecified trimester: Secondary | ICD-10-CM

## 2012-01-28 DIAGNOSIS — O9989 Other specified diseases and conditions complicating pregnancy, childbirth and the puerperium: Secondary | ICD-10-CM

## 2012-01-28 DIAGNOSIS — O09529 Supervision of elderly multigravida, unspecified trimester: Secondary | ICD-10-CM

## 2012-01-28 DIAGNOSIS — IMO0002 Reserved for concepts with insufficient information to code with codable children: Secondary | ICD-10-CM

## 2012-01-28 DIAGNOSIS — Z283 Underimmunization status: Secondary | ICD-10-CM

## 2012-01-28 NOTE — Progress Notes (Signed)
Patient doing well without complaints. FM/Labor precautions reviewed. 

## 2012-02-01 ENCOUNTER — Encounter (HOSPITAL_COMMUNITY): Payer: Self-pay | Admitting: *Deleted

## 2012-02-01 ENCOUNTER — Inpatient Hospital Stay (HOSPITAL_COMMUNITY)
Admission: AD | Admit: 2012-02-01 | Discharge: 2012-02-02 | DRG: 372 | Disposition: A | Discharge status: Home / Self Care | Payer: BC Managed Care – PPO | Source: Ambulatory Visit | Attending: Obstetrics & Gynecology | Admitting: Obstetrics & Gynecology

## 2012-02-01 DIAGNOSIS — O4290 Premature rupture of membranes, unspecified as to length of time between rupture and onset of labor, unspecified weeks of gestation: Secondary | ICD-10-CM

## 2012-02-01 DIAGNOSIS — O09529 Supervision of elderly multigravida, unspecified trimester: Secondary | ICD-10-CM | POA: Diagnosis present

## 2012-02-01 DIAGNOSIS — IMO0001 Reserved for inherently not codable concepts without codable children: Secondary | ICD-10-CM

## 2012-02-01 LAB — CBC
MCH: 25.4 pg — ABNORMAL LOW (ref 26.0–34.0)
MCV: 79 fL (ref 78.0–100.0)
Platelets: 223 10*3/uL (ref 150–400)
RBC: 4.14 MIL/uL (ref 3.87–5.11)

## 2012-02-01 MED ORDER — DIBUCAINE 1 % RE OINT: 1.0000 "application " | TOPICAL_OINTMENT | RECTAL | Status: DC | PRN

## 2012-02-01 MED ORDER — TERBUTALINE SULFATE 1 MG/ML IJ SOLN: 0.2500 mg | Freq: Once | INTRAMUSCULAR | Status: DC | PRN

## 2012-02-01 MED ORDER — LACTATED RINGERS IV SOLN
INTRAVENOUS | Status: DC
  Administered 2012-02-01: 04:00:00 via INTRAVENOUS

## 2012-02-01 MED ORDER — ONDANSETRON HCL 4 MG/2ML IJ SOLN: 4.0000 mg | INTRAMUSCULAR | Status: DC | PRN

## 2012-02-01 MED ORDER — HYDROXYZINE HCL 50 MG/ML IM SOLN
50.0000 mg | Freq: Four times a day (QID) | INTRAMUSCULAR | Status: DC | PRN
  Filled 2012-02-01: qty 1

## 2012-02-01 MED ORDER — DIPHENHYDRAMINE HCL 25 MG PO CAPS: 25.0000 mg | ORAL_CAPSULE | Freq: Four times a day (QID) | ORAL | Status: DC | PRN

## 2012-02-01 MED ORDER — LACTATED RINGERS IV SOLN: 500.0000 mL | INTRAVENOUS | Status: DC | PRN

## 2012-02-01 MED ORDER — OXYTOCIN BOLUS FROM INFUSION
500.0000 mL | Freq: Once | INTRAVENOUS | Status: AC
  Administered 2012-02-01: 500 mL via INTRAVENOUS
  Filled 2012-02-01: qty 500

## 2012-02-01 MED ORDER — ONDANSETRON HCL 4 MG/2ML IJ SOLN: 4.0000 mg | Freq: Four times a day (QID) | INTRAMUSCULAR | Status: DC | PRN

## 2012-02-01 MED ORDER — CITRIC ACID-SODIUM CITRATE 334-500 MG/5ML PO SOLN: 30.0000 mL | ORAL | Status: DC | PRN

## 2012-02-01 MED ORDER — PRENATAL MULTIVITAMIN CH
1.0000 | ORAL_TABLET | Freq: Every day | ORAL | Status: DC
  Administered 2012-02-01 – 2012-02-02 (×2): 1 via ORAL
  Filled 2012-02-01 (×2): qty 1

## 2012-02-01 MED ORDER — LANOLIN HYDROUS EX OINT: TOPICAL_OINTMENT | CUTANEOUS | Status: DC | PRN

## 2012-02-01 MED ORDER — IBUPROFEN 600 MG PO TABS: 600.0000 mg | ORAL_TABLET | Freq: Four times a day (QID) | ORAL | Status: DC | PRN

## 2012-02-01 MED ORDER — WITCH HAZEL-GLYCERIN EX PADS: 1.0000 "application " | MEDICATED_PAD | CUTANEOUS | Status: DC | PRN

## 2012-02-01 MED ORDER — EPHEDRINE 5 MG/ML INJ: 10.0000 mg | INTRAVENOUS | Status: DC | PRN

## 2012-02-01 MED ORDER — PHENYLEPHRINE 40 MCG/ML (10ML) SYRINGE FOR IV PUSH (FOR BLOOD PRESSURE SUPPORT): 80.0000 ug | PREFILLED_SYRINGE | INTRAVENOUS | Status: DC | PRN

## 2012-02-01 MED ORDER — LIDOCAINE HCL (PF) 1 % IJ SOLN
30.0000 mL | INTRAMUSCULAR | Status: DC | PRN
  Administered 2012-02-01: 30 mL via SUBCUTANEOUS
  Filled 2012-02-01: qty 30

## 2012-02-01 MED ORDER — OXYCODONE-ACETAMINOPHEN 5-325 MG PO TABS: 1.0000 | ORAL_TABLET | ORAL | Status: DC | PRN

## 2012-02-01 MED ORDER — LACTATED RINGERS IV SOLN: 500.0000 mL | Freq: Once | INTRAVENOUS | Status: DC

## 2012-02-01 MED ORDER — TETANUS-DIPHTH-ACELL PERTUSSIS 5-2.5-18.5 LF-MCG/0.5 IM SUSP: 0.5000 mL | Freq: Once | INTRAMUSCULAR | Status: DC

## 2012-02-01 MED ORDER — FENTANYL 2.5 MCG/ML BUPIVACAINE 1/10 % EPIDURAL INFUSION (WH - ANES): 14.0000 mL/h | INTRAMUSCULAR | Status: DC

## 2012-02-01 MED ORDER — ZOLPIDEM TARTRATE 5 MG PO TABS: 5.0000 mg | ORAL_TABLET | Freq: Every evening | ORAL | Status: DC | PRN

## 2012-02-01 MED ORDER — SIMETHICONE 80 MG PO CHEW: 80.0000 mg | CHEWABLE_TABLET | ORAL | Status: DC | PRN

## 2012-02-01 MED ORDER — ACETAMINOPHEN 325 MG PO TABS: 650.0000 mg | ORAL_TABLET | ORAL | Status: DC | PRN

## 2012-02-01 MED ORDER — ONDANSETRON HCL 4 MG PO TABS: 4.0000 mg | ORAL_TABLET | ORAL | Status: DC | PRN

## 2012-02-01 MED ORDER — BENZOCAINE-MENTHOL 20-0.5 % EX AERO
1.0000 "application " | INHALATION_SPRAY | CUTANEOUS | Status: DC | PRN
  Administered 2012-02-01: 1 via TOPICAL
  Filled 2012-02-01: qty 56

## 2012-02-01 MED ORDER — DIPHENHYDRAMINE HCL 50 MG/ML IJ SOLN: 12.5000 mg | INTRAMUSCULAR | Status: DC | PRN

## 2012-02-01 MED ORDER — OXYTOCIN 40 UNITS IN LACTATED RINGERS INFUSION - SIMPLE MED
62.5000 mL/h | Freq: Once | INTRAVENOUS | Status: DC
  Filled 2012-02-01: qty 1000

## 2012-02-01 MED ORDER — OXYTOCIN 40 UNITS IN LACTATED RINGERS INFUSION - SIMPLE MED
1.0000 m[IU]/min | INTRAVENOUS | Status: DC
  Administered 2012-02-01: 2 m[IU]/min via INTRAVENOUS
  Filled 2012-02-01: qty 1000

## 2012-02-01 MED ORDER — IBUPROFEN 600 MG PO TABS
600.0000 mg | ORAL_TABLET | Freq: Four times a day (QID) | ORAL | Status: DC
  Administered 2012-02-01 – 2012-02-02 (×5): 600 mg via ORAL
  Filled 2012-02-01 (×5): qty 1

## 2012-02-01 MED ORDER — HYDROXYZINE HCL 50 MG PO TABS: 50.0000 mg | ORAL_TABLET | Freq: Four times a day (QID) | ORAL | Status: DC | PRN

## 2012-02-01 MED ORDER — SENNOSIDES-DOCUSATE SODIUM 8.6-50 MG PO TABS
2.0000 | ORAL_TABLET | Freq: Every day | ORAL | Status: DC
  Administered 2012-02-01: 2 via ORAL

## 2012-02-01 NOTE — Progress Notes (Signed)
S. Comfortable without epidural. O. VSS, AF      Pit at 12       irreg CTX q 2-7 min      CVX- was 1/100/-1 but I was able to break up the cervical scar tissue and her cervix is now 4/100/-1      AROM done (a bulging bag was felt)      FHR category 2- she has good variability but she has had 2 late decels in 15 mins. A/P. Labor at term- expect SVD but we will watch FHR closely.

## 2012-02-01 NOTE — H&P (Signed)
  Lisa Landry is a 36 y.o. female G2P1001 with IUP at [redacted]w[redacted]d presenting for ROM at 2230. Pt states she has been having none contractions, associated with none vaginal bleeding, membranes are ruptured, clear fluid, with active fetal movement.   [facility] 9 wks  Prenatal History/Complications: AMA Past Medical History: Past Medical History  Diagnosis Date  . Abnormal Pap smear     Past Surgical History: Past Surgical History  Procedure Date  . Colposcopy     abnormal pap.  . Wisdom tooth extraction     x2    Obstetrical History: OB History    Grav Para Term Preterm Abortions TAB SAB Ect Mult Living   2 1 1       1       Gynecological History: OB History    Grav Para Term Preterm Abortions TAB SAB Ect Mult Living   2 1 1       1       Social History: History   Social History  . Marital Status: Single    Spouse Name: N/A    Number of Children: N/A  . Years of Education: N/A   Social History Main Topics  . Smoking status: Never Smoker   . Smokeless tobacco: Not on file  . Alcohol Use: No  . Drug Use:   . Sexually Active: Yes    Birth Control/ Protection: None   Other Topics Concern  . Not on file   Social History Narrative  . No narrative on file    Family History: Family History  Problem Relation Age of Onset  . Hypothyroidism Mother   . Hypothyroidism Maternal Aunt     Allergies: No Known Allergies  Prescriptions prior to admission  Medication Sig Dispense Refill  . Prenatal Vit-Fe Fumarate-FA (MULTIVITAMIN-PRENATAL) 27-0.8 MG TABS Take 1 tablet by mouth daily.        Review of Systems - Negative   Blood pressure 135/72, pulse 81, temperature 98.2 F (36.8 C), temperature source Oral, resp. rate 20, height 5' (1.524 m), weight 162 lb (73.483 kg), last menstrual period 05/01/2011, SpO2 100.00%. General appearance: alert, cooperative and no distress Lungs: clear to auscultation bilaterally Heart: regular rate and  rhythm Abdomen: soft, non-tender; bowel sounds normal Extremities: Homans sign is negative, no sign of DVT DTR's 2+ Presentation: cephalic Fetal monitoringBaseline: 140 bpm and avg LTV, + accels, no decels Uterine activityNone     Prenatal labs: ABO, Rh: O/POS/-- (02/20 1632) Antibody: NEG (02/20 1632) Rubella:   RPR: NON REAC (06/24 1544)  HBsAg: NEGATIVE (02/20 1632)  HIV: NON REACTIVE (06/24 1544)  GBS:    1 hr Glucola 77 Genetic screening  normal Anatomy US normal  .Assessment: Lisa Landry is a 36 y.o. G2P1001 with an IUP at [redacted]w[redacted]d presenting for PROM  Plan: Pt wants to try to have ababy without epidural, so will allow 6 hours to see if she will go into labor.  Agrees to Pit augmentation PRN   CRESENZO-DISHMAN,Bracken Moffa 02/01/2012, 2:22 AM

## 2012-02-01 NOTE — Progress Notes (Signed)
Christe Tellez is a 36 y.o. G2P1001 at [redacted]w[redacted]d admitted for rupture of membranes.  Subjective: Comfortable, not feeling painful ctx  Objective: BP 113/67  Pulse 87  Temp 98 F (36.7 C) (Oral)  Resp 18  Ht 5' (1.524 m)  Wt 73.483 kg (162 lb)  BMI 31.64 kg/m2  SpO2 100%  LMP 05/01/2011 I/O last 3 completed shifts: In: 13.2 [I.V.:13.2] Out: -     FHT:  FHR: 140 bpm, variability: moderate,  accelerations:  Present,  decelerations:  Absent UC:   regular, every 2-4 minutes SVE:   Dilation: 1 Effacement (%): 100 Station: -2 Exam by:: Donette Larry, CNM student  Labs: Lab Results  Component Value Date   WBC 6.9 02/01/2012   HGB 10.5* 02/01/2012   HCT 32.7* 02/01/2012   MCV 79.0 02/01/2012   PLT 223 02/01/2012    Assessment / Plan: IUP@39 .3 PROM Latent labor  Continue Pitocin, anesthesia/analgesia prn, anticipate SVD. Lawernce Pitts 02/01/2012, 7:42 AM

## 2012-02-01 NOTE — [facility] Note (Signed)
Pt reports ROM at 2230, clear fluid. Denies contractions.

## 2012-02-02 MED ORDER — MEASLES, MUMPS & RUBELLA VAC ~~LOC~~ INJ
0.5000 mL | INJECTION | Freq: Once | SUBCUTANEOUS | Status: AC
  Administered 2012-02-02: 0.5 mL via SUBCUTANEOUS
  Filled 2012-02-02 (×3): qty 0.5

## 2012-02-02 MED ORDER — IBUPROFEN 600 MG PO TABS: 600.0000 mg | ORAL_TABLET | Freq: Four times a day (QID) | ORAL | Status: DC

## 2012-02-02 NOTE — Discharge Summary (Signed)
Obstetric Discharge Summary Reason for Admission: onset of labor and rupture of membranes Prenatal Procedures: ultrasound Intrapartum Procedures: spontaneous vaginal delivery Postpartum Procedures: none Complications-Operative and Postpartum: none Hemoglobin  Date Value Range Status  02/01/2012 10.5* 12.0 - 15.0 g/dL Final     HCT  Date Value Range Status  02/01/2012 32.7* 36.0 - 46.0 % Final    Physical Exam:  General: alert, cooperative and no distress Lochia: appropriate Uterine Fundus: firm Incision: n/a DVT Evaluation: No evidence of DVT seen on physical exam.  Discharge Diagnoses: Term Pregnancy-delivered  Discharge Information: Date: 02/02/2012 Activity: pelvic rest Diet: routine Medications: Ibuprofen Condition: stable Instructions: refer to practice specific booklet Discharge to: home   Newborn Data: Live born female  Birth Weight: 6 lb 14.1 oz (3120 g) APGAR: 8, 9  Home with mother.  Napoleon Form 02/02/2012, 6:17 AM

## 2012-02-03 NOTE — H&P (Signed)
Attestation of Attending Supervision of Advanced Practitioner (CNM/NP): Evaluation and management procedures were performed by the Advanced Practitioner under my supervision and collaboration.  I have reviewed the Advanced Practitioner's note and chart, and I agree with the management and plan.  HARRAWAY-SMITH, Laneah Luft 7:20 PM     

## 2012-02-03 NOTE — Progress Notes (Signed)
I have seen and examined this patient and agree the above assessment. CRESENZO-DISHMAN,Lilou Kneip 02/03/2012 11:51 AM   

## 2012-02-04 ENCOUNTER — Encounter: Payer: BC Managed Care – PPO | Admitting: Obstetrics & Gynecology

## 2012-02-05 NOTE — Progress Notes (Signed)
Post discharge chart review completed.  

## 2012-02-14 ENCOUNTER — Ambulatory Visit (INDEPENDENT_AMBULATORY_CARE_PROVIDER_SITE_OTHER): Payer: BC Managed Care – PPO | Admitting: *Deleted

## 2012-02-14 DIAGNOSIS — Z3049 Encounter for surveillance of other contraceptives: Secondary | ICD-10-CM

## 2012-02-14 DIAGNOSIS — Z3042 Encounter for surveillance of injectable contraceptive: Secondary | ICD-10-CM

## 2012-02-14 MED ORDER — MEDROXYPROGESTERONE ACETATE 150 MG/ML IM SUSP
150.0000 mg | Freq: Once | INTRAMUSCULAR | Status: AC
  Administered 2012-02-14: 150 mg via INTRAMUSCULAR

## 2012-02-14 MED ORDER — MEDROXYPROGESTERONE ACETATE 150 MG/ML IM SUSP: 150.0000 mg | INTRAMUSCULAR | Status: DC

## 2012-02-14 NOTE — Patient Instructions (Signed)
Patient is here for her Depo Provera injection.  She has not had intercourse yet since delivery and has taken Depo in the past.  She will return to clinic in 3 months for next injection and she will call if there are any problems or concerns.

## 2012-03-24 ENCOUNTER — Ambulatory Visit (INDEPENDENT_AMBULATORY_CARE_PROVIDER_SITE_OTHER): Payer: BC Managed Care – PPO | Admitting: Obstetrics and Gynecology

## 2012-03-24 MED ORDER — FLUCONAZOLE 150 MG PO TABS: 150.0000 mg | ORAL_TABLET | Freq: Once | ORAL | Status: DC

## 2012-03-24 NOTE — Progress Notes (Signed)
Here today for post partum visit, has not stopped bleeding since delivery.  Baby has thrust, pediatrician recommended Lisa Landry be treated as well.

## 2012-03-24 NOTE — Progress Notes (Signed)
  Subjective:     Lisa Landry is a 36 y.o. female who presents for a postpartum visit. She is 8 weeks postpartum following a spontaneous vaginal delivery. I have fully reviewed the prenatal and intrapartum course. The delivery was at 39.3 gestational weeks. Outcome: spontaneous vaginal delivery. Anesthesia: none. Postpartum course has been uncomplicated. Baby's course has been uncomplicated. Baby is feeding by breast. Bleeding no bleeding. Bowel function is normal. Bladder function is normal. Patient is sexually active. Contraception method is condoms and Depo-Provera injections. Postpartum depression screening: negative.     Review of Systems A comprehensive review of systems was negative.   Objective:    BP 116/58  Pulse 73  Ht 5' (1.524 m)  Wt 135 lb (61.236 kg)  BMI 26.37 kg/m2  Breastfeeding? Yes  General:  alert, cooperative and no distress   Breasts:  inspection negative, no nipple discharge or bleeding, no masses or nodularity palpable  Lungs: clear to auscultation bilaterally  Heart:  regular rate and rhythm  Abdomen: soft, non-tender; bowel sounds normal; no masses,  no organomegaly   Vulva:  normal  Vagina: normal vagina, no discharge, exudate, lesion, or erythema  Cervix:  multiparous appearance  Corpus: normal size, contour, position, consistency, mobility, non-tender  Adnexa:  no mass, fullness, tenderness  Rectal Exam: Not performed.        Assessment:    Normal postpartum exam. Pap smear not done at today's visit.   Plan:    1. Contraception: Depo-Provera injections 2. Patient medically cleared to resume all activities of daily living 3. Follow up in: 5 months for annual exam or as needed.

## 2012-05-14 ENCOUNTER — Ambulatory Visit: Payer: BC Managed Care – PPO

## 2014-03-08 ENCOUNTER — Encounter (HOSPITAL_COMMUNITY): Payer: Self-pay | Admitting: *Deleted

## 2016-05-07 NOTE — L&D Delivery Note (Signed)
Patient is 41 y.o. [id number] 1457w4d admitted for PPROM. S/p Augmentation with Pitocin Prenatal course uncomplicated.   Delivery Note At 9:57 AM a viable female was delivered via Vaginal, Spontaneous Delivery (Presentation:LOA ;Cephalic  ).  APGAR: 7,9 ; weight Pending .   Placenta status: Intact, .  Cord:  3 vessel without complication. Anesthesia: None Episiotomy: None Lacerations:  None Suture Repair: NA Est. Blood Loss (mL): 200  Mom to postpartum.  Baby to Couplet care / Skin to Skin.  Denyse AmassCorey P Akia Desroches 12/15/2016, 10:28 AM    Upon arrival patient was complete and pushing. She pushed with good maternal effort to deliver a viable Female infant in cephalic, LOA position. Nuchal x1 cord,easily reduced.  Baby delivered without difficulty, was noted to have good tone and place on maternal abdomen for oral suctioning, drying and stimulation. Delayed cord clamping performed. Placenta delivered spontaneously with gentle cord traction. Fundus firm with massage and Pitocin. Perineum inspected and found to have no laceration. Counts of sharps, instruments, and lap pads were all correct.

## 2016-05-30 ENCOUNTER — Ambulatory Visit (INDEPENDENT_AMBULATORY_CARE_PROVIDER_SITE_OTHER): Payer: BC Managed Care – PPO | Admitting: Family Medicine

## 2016-05-30 ENCOUNTER — Encounter: Payer: Self-pay | Admitting: Family Medicine

## 2016-05-30 DIAGNOSIS — Z113 Encounter for screening for infections with a predominantly sexual mode of transmission: Secondary | ICD-10-CM

## 2016-05-30 DIAGNOSIS — O09529 Supervision of elderly multigravida, unspecified trimester: Secondary | ICD-10-CM

## 2016-05-30 DIAGNOSIS — Z3490 Encounter for supervision of normal pregnancy, unspecified, unspecified trimester: Secondary | ICD-10-CM

## 2016-05-30 DIAGNOSIS — Z23 Encounter for immunization: Secondary | ICD-10-CM | POA: Diagnosis not present

## 2016-05-30 DIAGNOSIS — O09521 Supervision of elderly multigravida, first trimester: Secondary | ICD-10-CM

## 2016-05-30 DIAGNOSIS — O0990 Supervision of high risk pregnancy, unspecified, unspecified trimester: Secondary | ICD-10-CM

## 2016-05-30 NOTE — Progress Notes (Signed)
Oldest child lymphoma Last pap within 1 yr - normal Desires NIPS

## 2016-05-30 NOTE — Patient Instructions (Addendum)
First Trimester of Pregnancy °The first trimester of pregnancy is from week 1 until the end of week 12 (months 1 through 3). A week after a sperm fertilizes an egg, the egg will implant on the wall of the uterus. This embryo will begin to develop into a baby. Genes from you and your partner are forming the baby. The female genes determine whether the baby is a boy or a girl. At 6-8 weeks, the eyes and face are formed, and the heartbeat can be seen on ultrasound. At the end of 12 weeks, all the baby's organs are formed.  °Now that you are pregnant, you will want to do everything you can to have a healthy baby. Two of the most important things are to get good prenatal care and to follow your health care provider's instructions. Prenatal care is all the medical care you receive before the baby's birth. This care will help prevent, find, and treat any problems during the pregnancy and childbirth. °BODY CHANGES °Your body goes through many changes during pregnancy. The changes vary from woman to woman.  °· You may gain or lose a couple of pounds at first. °· You may feel sick to your stomach (nauseous) and throw up (vomit). If the vomiting is uncontrollable, call your health care provider. °· You may tire easily. °· You may develop headaches that can be relieved by medicines approved by your health care provider. °· You may urinate more often. Painful urination may mean you have a bladder infection. °· You may develop heartburn as a result of your pregnancy. °· You may develop constipation because certain hormones are causing the muscles that push waste through your intestines to slow down. °· You may develop hemorrhoids or swollen, bulging veins (varicose veins). °· Your breasts may begin to grow larger and become tender. Your nipples may stick out more, and the tissue that surrounds them (areola) may become darker. °· Your gums may bleed and may be sensitive to brushing and flossing. °· Dark spots or blotches (chloasma,  mask of pregnancy) may develop on your face. This will likely fade after the baby is born. °· Your menstrual periods will stop. °· You may have a loss of appetite. °· You may develop cravings for certain kinds of food. °· You may have changes in your emotions from day to day, such as being excited to be pregnant or being concerned that something may go wrong with the pregnancy and baby. °· You may have more vivid and strange dreams. °· You may have changes in your hair. These can include thickening of your hair, rapid growth, and changes in texture. Some women also have hair loss during or after pregnancy, or hair that feels dry or thin. Your hair will most likely return to normal after your baby is born. °WHAT TO EXPECT AT YOUR PRENATAL VISITS °During a routine prenatal visit: °· You will be weighed to make sure you and the baby are growing normally. °· Your blood pressure will be taken. °· Your abdomen will be measured to track your baby's growth. °· The fetal heartbeat will be listened to starting around week 10 or 12 of your pregnancy. °· Test results from any previous visits will be discussed. °Your health care provider may ask you: °· How you are feeling. °· If you are feeling the baby move. °· If you have had any abnormal symptoms, such as leaking fluid, bleeding, severe headaches, or abdominal cramping. °· If you are using any tobacco products,   including cigarettes, chewing tobacco, and electronic cigarettes. °· If you have any questions. °Other tests that may be performed during your first trimester include: °· Blood tests to find your blood type and to check for the presence of any previous infections. They will also be used to check for low iron levels (anemia) and Rh antibodies. Later in the pregnancy, blood tests for diabetes will be done along with other tests if problems develop. °· Urine tests to check for infections, diabetes, or protein in the urine. °· An ultrasound to confirm the proper growth  and development of the baby. °· An amniocentesis to check for possible genetic problems. °· Fetal screens for spina bifida and Down syndrome. °· You may need other tests to make sure you and the baby are doing well. °· HIV (human immunodeficiency virus) testing. Routine prenatal testing includes screening for HIV, unless you choose not to have this test. °HOME CARE INSTRUCTIONS  °Medicines  °· Follow your health care provider's instructions regarding medicine use. Specific medicines may be either safe or unsafe to take during pregnancy. °· Take your prenatal vitamins as directed. °· If you develop constipation, try taking a stool softener if your health care provider approves. °Diet  °· Eat regular, well-balanced meals. Choose a variety of foods, such as meat or vegetable-based protein, fish, milk and low-fat dairy products, vegetables, fruits, and whole grain breads and cereals. Your health care provider will help you determine the amount of weight gain that is right for you. °· Avoid raw meat and uncooked cheese. These carry germs that can cause birth defects in the baby. °· Eating four or five small meals rather than three large meals a day may help relieve nausea and vomiting. If you start to feel nauseous, eating a few soda crackers can be helpful. Drinking liquids between meals instead of during meals also seems to help nausea and vomiting. °· If you develop constipation, eat more high-fiber foods, such as fresh vegetables or fruit and whole grains. Drink enough fluids to keep your urine clear or pale yellow. °Activity and Exercise  °· Exercise only as directed by your health care provider. Exercising will help you: °¨ Control your weight. °¨ Stay in shape. °¨ Be prepared for labor and delivery. °· Experiencing pain or cramping in the lower abdomen or low back is a good sign that you should stop exercising. Check with your health care provider before continuing normal exercises. °· Try to avoid standing for  long periods of time. Move your legs often if you must stand in one place for a long time. °· Avoid heavy lifting. °· Wear low-heeled shoes, and practice good posture. °· You may continue to have sex unless your health care provider directs you otherwise. °Relief of Pain or Discomfort  °· Wear a good support bra for breast tenderness.   °· Take warm sitz baths to soothe any pain or discomfort caused by hemorrhoids. Use hemorrhoid cream if your health care provider approves.   °· Rest with your legs elevated if you have leg cramps or low back pain. °· If you develop varicose veins in your legs, wear support hose. Elevate your feet for 15 minutes, 3-4 times a day. Limit salt in your diet. °Prenatal Care  °· Schedule your prenatal visits by the twelfth week of pregnancy. They are usually scheduled monthly at first, then more often in the last 2 months before delivery. °· Write down your questions. Take them to your prenatal visits. °· Keep all your prenatal   visits as directed by your health care provider. °Safety  °· Wear your seat belt at all times when driving. °· Make a list of emergency phone numbers, including numbers for family, friends, the hospital, and police and fire departments. °General Tips  °· Ask your health care provider for a referral to a local prenatal education class. Begin classes no later than at the beginning of month 6 of your pregnancy. °· Ask for help if you have counseling or nutritional needs during pregnancy. Your health care provider can offer advice or refer you to specialists for help with various needs. °· Do not use hot tubs, steam rooms, or saunas. °· Do not douche or use tampons or scented sanitary pads. °· Do not cross your legs for long periods of time. °· Avoid cat litter boxes and soil used by cats. These carry germs that can cause birth defects in the baby and possibly loss of the fetus by miscarriage or stillbirth. °· Avoid all smoking, herbs, alcohol, and medicines not  prescribed by your health care provider. Chemicals in these affect the formation and growth of the baby. °· Do not use any tobacco products, including cigarettes, chewing tobacco, and electronic cigarettes. If you need help quitting, ask your health care provider. You may receive counseling support and other resources to help you quit. °· Schedule a dentist appointment. At home, brush your teeth with a soft toothbrush and be gentle when you floss. °SEEK MEDICAL CARE IF:  °· You have dizziness. °· You have mild pelvic cramps, pelvic pressure, or nagging pain in the abdominal area. °· You have persistent nausea, vomiting, or diarrhea. °· You have a bad smelling vaginal discharge. °· You have pain with urination. °· You notice increased swelling in your face, hands, legs, or ankles. °SEEK IMMEDIATE MEDICAL CARE IF:  °· You have a fever. °· You are leaking fluid from your vagina. °· You have spotting or bleeding from your vagina. °· You have severe abdominal cramping or pain. °· You have rapid weight gain or loss. °· You vomit blood or material that looks like coffee grounds. °· You are exposed to German measles and have never had them. °· You are exposed to fifth disease or chickenpox. °· You develop a severe headache. °· You have shortness of breath. °· You have any kind of trauma, such as from a fall or a car accident. °This information is not intended to replace advice given to you by your health care provider. Make sure you discuss any questions you have with your health care provider. °Document Released: 04/17/2001 Document Revised: 05/14/2014 Document Reviewed: 03/03/2013 °Elsevier Interactive Patient Education © 2017 [employer] ° °Safe Medications in Pregnancy  ° °Acne: °Benzoyl Peroxide °Salicylic Acid ° °Backache/Headache: °Tylenol: 2 regular strength every 4 hours OR °             2 Extra strength every 6 hours ° °Colds/Coughs/Allergies: °Benadryl (alcohol free) 25 mg every 6 hours as needed °Breath right  strips °Claritin °Cepacol throat lozenges °Chloraseptic throat spray °Cold-Eeze- up to three times per day °Cough drops, alcohol free °Flonase (by prescription only) °Guaifenesin °Mucinex °Robitussin DM (plain only, alcohol free) °Saline nasal spray/drops °Sudafed (pseudoephedrine) & Actifed ** use only after [redacted] weeks gestation and if you do not have high blood pressure °Tylenol °Vicks Vaporub °Zinc lozenges °Zyrtec  ° °Constipation: °Colace °Ducolax suppositories °Fleet enema °Glycerin suppositories °Metamucil °Milk of magnesia °Miralax °Senokot °Smooth move tea ° °Diarrhea: °Kaopectate °Imodium A-D ° °*NO pepto Bismol ° °  Hemorrhoids: °Anusol °Anusol HC °Preparation H °Tucks ° °Indigestion: °Tums °Maalox °Mylanta °Zantac  °Pepcid ° °Insomnia: °Benadryl (alcohol free) 25mg every 6 hours as needed °Tylenol PM °Unisom, no Gelcaps ° °Leg Cramps: °Tums °MagGel ° °Nausea/Vomiting:  °Bonine °Dramamine °Emetrol °Ginger extract °Sea bands °Meclizine  °Nausea medication to take during pregnancy:  °Unisom (doxylamine succinate 25 mg tablets) Take one tablet daily at bedtime. If symptoms are not adequately controlled, the dose can be increased to a maximum recommended dose of two tablets daily (1/2 tablet in the morning, 1/2 tablet mid-afternoon and one at bedtime). °Vitamin B6 100mg tablets. Take one tablet twice a day (up to 200 mg per day). ° °Skin Rashes: °Aveeno products °Benadryl cream or 25mg every 6 hours as needed °Calamine Lotion °1% cortisone cream ° °Yeast infection: °Gyne-lotrimin 7 °Monistat 7 ° ° °**If taking multiple medications, please check labels to avoid duplicating the same active ingredients °**take medication as directed on the label °** Do not exceed 4000 mg of tylenol in 24 hours °**Do not take medications that contain aspirin or ibuprofen ° ° ° ° ° °

## 2016-05-30 NOTE — Progress Notes (Signed)
   PRENATAL VISIT NOTE  Subjective:  Lisa Landry is a 41 y.o. G3P2002 at 5540w3d being seen today for ongoing prenatal care.  She is currently monitored for the following issues for this high-risk pregnancy and has Supervision of high risk pregnancy, antepartum and Multigravida of advanced maternal age on her problem list.  Patient reports no complaints.  Contractions: Not present. Vag. Bleeding: None.  Movement: Absent. Denies leaking of fluid.   The following portions of the patient's history were reviewed and updated as appropriate: allergies, current medications, past family history, past medical history, past social history, past surgical history and problem list. Problem list updated.  Objective:   Vitals:   05/30/16 0853  BP: 114/78  Pulse: 84  Weight: 141 lb (64 kg)    Fetal Status:     Movement: Absent     General:  Alert, oriented and cooperative. Patient is in no acute distress.  Skin: Skin is warm and dry. No rash noted.   Cardiovascular: Normal heart rate noted  Respiratory: Normal respiratory effort, no problems with respiration noted  Abdomen: Soft, gravid, appropriate for gestational age. Pain/Pressure: Absent     Pelvic:  Cervical exam deferred        Extremities: Normal range of motion.  Edema: None  Mental Status: Normal mood and affect. Normal behavior. Normal judgment and thought content.   Breast exam: normal appearing breast tissue, on asymmetry, retraction of nipple. No nipple discharge. No palpable masses  Assessment and Plan:  Pregnancy: G3P2002 at 7940w3d  1. Encounter for supervision of normal pregnancy, antepartum, unspecified gravidity - [facility] - Reviewed options for genetic screening, opted for NIPT - US bedside- reviewed and changed patient's EDD based on this US - Glucose, random - Hemoglobin A1c - Hemoglobinopathy evaluation - Obstetric Panel, Including HIV - GC[facility]Chlam[facility] probe amp [facility] - ToxASSURE  Select 13 (MW), Urine - Flu Vaccine QUAD 36+ mos IM - Culture, OB Urine - Genetic Screening - Patient will find out where her last pap was performed and call us  2. Antepartum multigravida of advanced maternal age Reviewed plan of care for increased monitoring given age >=40   Preterm labor symptoms and general obstetric precautions including but not limited to vaginal bleeding, contractions, leaking of fluid and fetal movement were reviewed in detail with the patient. Please refer to After Visit Summary for other counseling recommendations.   Return in about 4 weeks (around 06/27/2016) for Routine prenatal care.   Federico FlakeKimberly Niles Newton, MD

## 2016-05-31 LAB — GC/CHLAMYDIA PROBE AMP (~~LOC~~) NOT AT ARMC
CHLAMYDIA, DNA PROBE: NEGATIVE
NEISSERIA GONORRHEA: NEGATIVE

## 2016-06-04 LAB — HEMOGLOBIN A1C
Est. average glucose Bld gHb Est-mCnc: 97 mg/dL
Hgb A1c MFr Bld: 5 % (ref 4.8–5.6)

## 2016-06-04 LAB — URINE CULTURE, OB REFLEX

## 2016-06-04 LAB — HEMOGLOBINOPATHY EVALUATION
HGB A: 97.9 % (ref 96.4–98.8)
HGB C: 0 %
HGB S: 0 %
HGB VARIANT: 0 %
Hemoglobin A2 Quantitation: 2.1 % (ref 1.8–3.2)
Hemoglobin F Quantitation: 0 % (ref 0.0–2.0)

## 2016-06-04 LAB — OBSTETRIC PANEL, INCLUDING HIV
ANTIBODY SCREEN: NEGATIVE
BASOS ABS: 0 10*3/uL (ref 0.0–0.2)
BASOS: 1 %
EOS (ABSOLUTE): 0.1 10*3/uL (ref 0.0–0.4)
Eos: 3 %
HIV SCREEN 4TH GENERATION: NONREACTIVE
Hematocrit: 40.7 % (ref 34.0–46.6)
Hemoglobin: 13.2 g/dL (ref 11.1–15.9)
Hepatitis B Surface Ag: NEGATIVE
Immature Grans (Abs): 0 10*3/uL (ref 0.0–0.1)
Immature Granulocytes: 0 %
LYMPHS: 25 %
Lymphocytes Absolute: 1.1 10*3/uL (ref 0.7–3.1)
MCH: 26.1 pg — ABNORMAL LOW (ref 26.6–33.0)
MCHC: 32.4 g/dL (ref 31.5–35.7)
MCV: 81 fL (ref 79–97)
MONOCYTES: 10 %
MONOS ABS: 0.5 10*3/uL (ref 0.1–0.9)
NEUTROS ABS: 2.7 10*3/uL (ref 1.4–7.0)
Neutrophils: 61 %
PLATELETS: 242 10*3/uL (ref 150–379)
RBC: 5.05 x10E6/uL (ref 3.77–5.28)
RDW: 14.3 % (ref 12.3–15.4)
RPR Ser Ql: NONREACTIVE
RUBELLA: 1.66 {index} (ref >0.99)
Rh Factor: POSITIVE
WBC: 4.4 10*3/uL (ref 3.4–10.8)

## 2016-06-04 LAB — CULTURE, OB URINE

## 2016-06-04 LAB — GLUCOSE, RANDOM: Glucose: 71 mg/dL (ref 65–99)

## 2016-06-05 LAB — TOXASSURE SELECT 13 (MW), URINE

## 2016-06-08 ENCOUNTER — Other Ambulatory Visit: Payer: Self-pay | Admitting: Family Medicine

## 2016-06-08 ENCOUNTER — Telehealth: Payer: Self-pay | Admitting: *Deleted

## 2016-06-08 ENCOUNTER — Encounter: Payer: Self-pay | Admitting: Family Medicine

## 2016-06-08 DIAGNOSIS — R8271 Bacteriuria: Secondary | ICD-10-CM | POA: Insufficient documentation

## 2016-06-08 MED ORDER — CLINDAMYCIN HCL 300 MG PO CAPS: 300.0000 mg | ORAL_CAPSULE | Freq: Two times a day (BID) | ORAL | 0 refills | Status: DC

## 2016-06-08 NOTE — Telephone Encounter (Signed)
-----   Message from Federico FlakeKimberly Niles Newton, MD sent at 06/08/2016  1:37 PM EST ----- Reviewed prenatal labs. All are normal except Urine culture which showed GBS. She will need to be on clindamycin in labor given PCN allergy and treatment now. Please call I have sent clindamycin to pharmacy on file

## 2016-06-08 NOTE — Telephone Encounter (Signed)
Called pt LMOM meds at pharm and rtn call if needed

## 2016-06-08 NOTE — Progress Notes (Signed)
Sent clinda to pharmacy on file

## 2016-06-26 ENCOUNTER — Ambulatory Visit (INDEPENDENT_AMBULATORY_CARE_PROVIDER_SITE_OTHER): Payer: BLUE CROSS/BLUE SHIELD | Admitting: Obstetrics & Gynecology

## 2016-06-26 VITALS — BP 113/72 | HR 78 | Wt 146.0 lb

## 2016-06-26 DIAGNOSIS — R8271 Bacteriuria: Secondary | ICD-10-CM

## 2016-06-26 DIAGNOSIS — Z3689 Encounter for other specified antenatal screening: Secondary | ICD-10-CM

## 2016-06-26 DIAGNOSIS — O09522 Supervision of elderly multigravida, second trimester: Secondary | ICD-10-CM

## 2016-06-26 DIAGNOSIS — Z3490 Encounter for supervision of normal pregnancy, unspecified, unspecified trimester: Secondary | ICD-10-CM

## 2016-06-26 DIAGNOSIS — O09529 Supervision of elderly multigravida, unspecified trimester: Secondary | ICD-10-CM

## 2016-06-26 NOTE — Progress Notes (Signed)
   PRENATAL VISIT NOTE  Subjective:  Lisa Landry is a 41 y.o. G3P2002 at 3266w0d being seen today for ongoing prenatal care.  She is currently monitored for the following issues for this low-risk pregnancy and has Supervision of high risk pregnancy, antepartum; Multigravida of advanced maternal age; and GBS bacteriuria on her problem list.  Patient reports no complaints.  Contractions: Not present. Vag. Bleeding: None.  Movement: Absent. Denies leaking of fluid.   The following portions of the patient's history were reviewed and updated as appropriate: allergies, current medications, past family history, past medical history, past social history, past surgical history and problem list. Problem list updated.  Objective:   Vitals:   06/26/16 1623  BP: 113/72  Pulse: 78  Weight: 146 lb (66.2 kg)    Fetal Status: Fetal Heart Rate (bpm): 156   Movement: Absent     General:  Alert, oriented and cooperative. Patient is in no acute distress.  Skin: Skin is warm and dry. No rash noted.   Cardiovascular: Normal heart rate noted  Respiratory: Normal respiratory effort, no problems with respiration noted  Abdomen: Soft, gravid, appropriate for gestational age. Pain/Pressure: Absent     Pelvic:  Cervical exam deferred        Extremities: Normal range of motion.  Edema: None  Mental Status: Normal mood and affect. Normal behavior. Normal judgment and thought content.   Assessment and Plan:  Pregnancy: G3P2002 at 5966w0d  1. GBS bacteriuria TOC ordered - Culture, OB Urine  2. Encounter for fetal anatomic survey 3. Antepartum multigravida of advanced maternal age Anatomy scan ordered - US MFM OB DETAIL +14 WK; Future  4. Encounter for supervision of normal pregnancy, antepartum, unspecified gravidity No other complaints or concerns.  Routine obstetric precautions reviewed. Please refer to After Visit Summary for other counseling recommendations.  Return in about 4 weeks (around  07/24/2016) for OB Visit.   Tereso NewcomerUgonna A Alycea Segoviano, MD

## 2016-06-26 NOTE — Patient Instructions (Signed)
Return to clinic for any scheduled appointments or obstetric concerns, or go to [facility] for evaluation Third Trimester of Pregnancy The third trimester is from week 29 through week 40 (months 7 through 9). The third trimester is a time when the unborn baby (fetus) is growing rapidly. At the end of the ninth month, the fetus is about 20 inches in length and weighs 6-10 pounds. Body changes during your third trimester Your body goes through many changes during pregnancy. The changes vary from woman to woman. During the third trimester:  Your weight will continue to increase. You can expect to gain 25-35 pounds (11-16 kg) by the end of the pregnancy.  You may begin to get stretch marks on your hips, abdomen, and breasts.  You may urinate more often because the fetus is moving lower into your pelvis and pressing on your bladder.  You may develop or continue to have heartburn. This is caused by increased hormones that slow down muscles in the digestive tract.  You may develop or continue to have constipation because increased hormones slow digestion and cause the muscles that push waste through your intestines to relax.  You may develop hemorrhoids. These are swollen veins (varicose veins) in the rectum that can itch or be painful.  You may develop swollen, bulging veins (varicose veins) in your legs.  You may have increased body aches in the pelvis, back, or thighs. This is due to weight gain and increased hormones that are relaxing your joints.  You may have changes in your hair. These can include thickening of your hair, rapid growth, and changes in texture. Some women also have hair loss during or after pregnancy, or hair that feels dry or thin. Your hair will most likely return to normal after your baby is born.  Your breasts will continue to grow and they will continue to become tender. A yellow fluid (colostrum) may leak from your breasts. This is the first milk you are producing for your  baby.  Your belly button may stick out.  You may notice more swelling in your hands, face, or ankles.  You may have increased tingling or numbness in your hands, arms, and legs. The skin on your belly may also feel numb.  You may feel short of breath because of your expanding uterus.  You may have more problems sleeping. This can be caused by the size of your belly, increased need to urinate, and an increase in your body's metabolism.  You may notice the fetus "dropping," or moving lower in your abdomen.  You may have increased vaginal discharge.  Your cervix becomes thin and soft (effaced) near your due date. What to expect at prenatal visits You will have prenatal exams every 2 weeks until week 36. Then you will have weekly prenatal exams. During a routine prenatal visit:  You will be weighed to make sure you and the fetus are growing normally.  Your blood pressure will be taken.  Your abdomen will be measured to track your baby's growth.  The fetal heartbeat will be listened to.  Any test results from the previous visit will be discussed.  You may have a cervical check near your due date to see if you have effaced. At around 36 weeks, your health care provider will check your cervix. At the same time, your health care provider will also perform a test on the secretions of the vaginal tissue. This test is to determine if a type of bacteria, Group B streptococcus, is  present. Your health care provider will explain this further. Your health care provider may ask you:  What your birth plan is.  How you are feeling.  If you are feeling the baby move.  If you have had any abnormal symptoms, such as leaking fluid, bleeding, severe headaches, or abdominal cramping.  If you are using any tobacco products, including cigarettes, chewing tobacco, and electronic cigarettes.  If you have any questions. Other tests or screenings that may be performed during your third trimester  include:  Blood tests that check for low iron levels (anemia).  Fetal testing to check the health, activity level, and growth of the fetus. Testing is done if you have certain medical conditions or if there are problems during the pregnancy.  Nonstress test (NST). This test checks the health of your baby to make sure there are no signs of problems, such as the baby not getting enough oxygen. During this test, a belt is placed around your belly. The baby is made to move, and its heart rate is monitored during movement. What is false labor? False labor is a condition in which you feel small, irregular tightenings of the muscles in the womb (contractions) that eventually go away. These are called Braxton Hicks contractions. Contractions may last for hours, days, or even weeks before true labor sets in. If contractions come at regular intervals, become more frequent, increase in intensity, or become painful, you should see your health care provider. What are the signs of labor?  Abdominal cramps.  Regular contractions that start at 10 minutes apart and become stronger and more frequent with time.  Contractions that start on the top of the uterus and spread down to the lower abdomen and back.  Increased pelvic pressure and dull back pain.  A watery or bloody mucus discharge that comes from the vagina.  Leaking of amniotic fluid. This is also known as your "water breaking." It could be a slow trickle or a gush. Let your doctor know if it has a color or strange odor. If you have any of these signs, call your health care provider right away, even if it is before your due date. Follow these instructions at home: Eating and drinking  Continue to eat regular, healthy meals.  Do not eat:  Raw meat or meat spreads.  Unpasteurized milk or cheese.  Unpasteurized juice.  Store-made salad.  Refrigerated smoked seafood.  Hot dogs or deli meat, unless they are piping hot.  More than 6 ounces  of albacore tuna a week.  Shark, swordfish, king mackerel, or tile fish.  Store-made salads.  Raw sprouts, such as mung bean or alfalfa sprouts.  Take prenatal vitamins as told by your health care provider.  Take 1000 mg of calcium daily as told by your health care provider.  If you develop constipation:  Take over-the-counter or prescription medicines.  Drink enough fluid to keep your urine clear or pale yellow.  Eat foods that are high in fiber, such as fresh fruits and vegetables, whole grains, and beans.  Limit foods that are high in fat and processed sugars, such as fried and sweet foods. Activity  Exercise only as directed by your health care provider. Healthy pregnant women should aim for 2 hours and 30 minutes of moderate exercise per week. If you experience any pain or discomfort while exercising, stop.  Avoid heavy lifting.  Do not exercise in extreme heat or humidity, or at high altitudes.  Wear low-heel, comfortable shoes.  Practice good  posture.  Do not travel far distances unless it is absolutely necessary and only with the approval of your health care provider.  Wear your seat belt at all times while in a car, on a bus, or on a plane.  Take frequent breaks and rest with your legs elevated if you have leg cramps or low back pain.  Do not use hot tubs, steam rooms, or saunas.  You may continue to have sex unless your health care provider tells you otherwise. Lifestyle  Do not use any products that contain nicotine or tobacco, such as cigarettes and e-cigarettes. If you need help quitting, ask your health care provider.  Do not drink alcohol.  Do not use any medicinal herbs or unprescribed drugs. These chemicals affect the formation and growth of the baby.  If you develop varicose veins:  Wear support pantyhose or compression stockings as told by your healthcare provider.  Elevate your feet for 15 minutes, 3-4 times a day.  Wear a supportive  maternity bra to help with breast tenderness. General instructions  Take over-the-counter and prescription medicines only as told by your health care provider. There are medicines that are either safe or unsafe to take during pregnancy.  Take warm sitz baths to soothe any pain or discomfort caused by hemorrhoids. Use hemorrhoid cream or witch hazel if your health care provider approves.  Avoid cat litter boxes and soil used by cats. These carry germs that can cause birth defects in the baby. If you have a cat, ask someone to clean the litter box for you.  To prepare for the arrival of your baby:  Take prenatal classes to understand, practice, and ask questions about the labor and delivery.  Make a trial run to the hospital.  Visit the hospital and tour the maternity area.  Arrange for maternity or paternity leave through employers.  Arrange for family and friends to take care of pets while you are in the hospital.  Purchase a rear-facing car seat and make sure you know how to install it in your car.  Pack your hospital bag.  Prepare the baby's nursery. Make sure to remove all pillows and stuffed animals from the baby's crib to prevent suffocation.  Visit your dentist if you have not gone during your pregnancy. Use a soft toothbrush to brush your teeth and be gentle when you floss.  Keep all prenatal follow-up visits as told by your health care provider. This is important. Contact a health care provider if:  You are unsure if you are in labor or if your water has broken.  You become dizzy.  You have mild pelvic cramps, pelvic pressure, or nagging pain in your abdominal area.  You have lower back pain.  You have persistent nausea, vomiting, or diarrhea.  You have an unusual or bad smelling vaginal discharge.  You have pain when you urinate. Get help right away if:  You have a fever.  You are leaking fluid from your vagina.  You have spotting or bleeding from your  vagina.  You have severe abdominal pain or cramping.  You have rapid weight loss or weight gain.  You have shortness of breath with chest pain.  You notice sudden or extreme swelling of your face, hands, ankles, feet, or legs.  Your baby makes fewer than 10 movements in 2 hours.  You have severe headaches that do not go away with medicine.  You have vision changes. Summary  The third trimester is from week 29 through  week 40, months 7 through 9. The third trimester is a time when the unborn baby (fetus) is growing rapidly.  During the third trimester, your discomfort may increase as you and your baby continue to gain weight. You may have abdominal, leg, and back pain, sleeping problems, and an increased need to urinate.  During the third trimester your breasts will keep growing and they will continue to become tender. A yellow fluid (colostrum) may leak from your breasts. This is the first milk you are producing for your baby.  False labor is a condition in which you feel small, irregular tightenings of the muscles in the womb (contractions) that eventually go away. These are called Braxton Hicks contractions. Contractions may last for hours, days, or even weeks before true labor sets in.  Signs of labor can include: abdominal cramps; regular contractions that start at 10 minutes apart and become stronger and more frequent with time; watery or bloody mucus discharge that comes from the vagina; increased pelvic pressure and dull back pain; and leaking of amniotic fluid. This information is not intended to replace advice given to you by your health care provider. Make sure you discuss any questions you have with your health care provider. Document Released: 04/17/2001 Document Revised: 09/29/2015 Document Reviewed: 06/24/2012 [employer] Interactive Patient Education  2017 [employer].

## 2016-06-28 LAB — URINE CULTURE, OB REFLEX

## 2016-06-28 LAB — CULTURE, OB URINE

## 2016-07-24 ENCOUNTER — Encounter (HOSPITAL_COMMUNITY): Payer: Self-pay | Admitting: Obstetrics & Gynecology

## 2016-07-24 ENCOUNTER — Ambulatory Visit (INDEPENDENT_AMBULATORY_CARE_PROVIDER_SITE_OTHER): Payer: BLUE CROSS/BLUE SHIELD | Admitting: Family Medicine

## 2016-07-24 VITALS — BP 113/74 | HR 88 | Wt 154.0 lb

## 2016-07-24 DIAGNOSIS — O0990 Supervision of high risk pregnancy, unspecified, unspecified trimester: Secondary | ICD-10-CM

## 2016-07-24 DIAGNOSIS — O09522 Supervision of elderly multigravida, second trimester: Secondary | ICD-10-CM

## 2016-07-24 NOTE — Patient Instructions (Addendum)
 Second Trimester of Pregnancy The second trimester is from week 14 through week 27 (months 4 through 6). The second trimester is often a time when you feel your best. Your body has adjusted to being pregnant, and you begin to feel better physically. Usually, morning sickness has lessened or quit completely, you may have more energy, and you may have an increase in appetite. The second trimester is also a time when the fetus is growing rapidly. At the end of the sixth month, the fetus is about 9 inches long and weighs about 1 pounds. You will likely begin to feel the baby move (quickening) between 16 and 20 weeks of pregnancy. Body changes during your second trimester Your body continues to go through many changes during your second trimester. The changes vary from woman to woman.  Your weight will continue to increase. You will notice your lower abdomen bulging out.  You may begin to get stretch marks on your hips, abdomen, and breasts.  You may develop headaches that can be relieved by medicines. The medicines should be approved by your health care provider.  You may urinate more often because the fetus is pressing on your bladder.  You may develop or continue to have heartburn as a result of your pregnancy.  You may develop constipation because certain hormones are causing the muscles that push waste through your intestines to slow down.  You may develop hemorrhoids or swollen, bulging veins (varicose veins).  You may have back pain. This is caused by: ? Weight gain. ? Pregnancy hormones that are relaxing the joints in your pelvis. ? A shift in weight and the muscles that support your balance.  Your breasts will continue to grow and they will continue to become tender.  Your gums may bleed and may be sensitive to brushing and flossing.  Dark spots or blotches (chloasma, mask of pregnancy) may develop on your face. This will likely fade after the baby is born.  A dark line from  your belly button to the pubic area (linea nigra) may appear. This will likely fade after the baby is born.  You may have changes in your hair. These can include thickening of your hair, rapid growth, and changes in texture. Some women also have hair loss during or after pregnancy, or hair that feels dry or thin. Your hair will most likely return to normal after your baby is born.  What to expect at prenatal visits During a routine prenatal visit:  You will be weighed to make sure you and the fetus are growing normally.  Your blood pressure will be taken.  Your abdomen will be measured to track your baby's growth.  The fetal heartbeat will be listened to.  Any test results from the previous visit will be discussed.  Your health care provider may ask you:  How you are feeling.  If you are feeling the baby move.  If you have had any abnormal symptoms, such as leaking fluid, bleeding, severe headaches, or abdominal cramping.  If you are using any tobacco products, including cigarettes, chewing tobacco, and electronic cigarettes.  If you have any questions.  Other tests that may be performed during your second trimester include:  Blood tests that check for: ? Low iron levels (anemia). ? High blood sugar that affects pregnant women (gestational diabetes) between 24 and 28 weeks. ? Rh antibodies. This is to check for a protein on red blood cells (Rh factor).  Urine tests to check for infections, diabetes,   or protein in the urine.  An ultrasound to confirm the proper growth and development of the baby.  An amniocentesis to check for possible genetic problems.  Fetal screens for spina bifida and Down syndrome.  HIV (human immunodeficiency virus) testing. Routine prenatal testing includes screening for HIV, unless you choose not to have this test.  Follow these instructions at home: Medicines  Follow your health care provider's instructions regarding medicine use. Specific  medicines may be either safe or unsafe to take during pregnancy.  Take a prenatal vitamin that contains at least 600 micrograms (mcg) of folic acid.  If you develop constipation, try taking a stool softener if your health care provider approves. Eating and drinking  Eat a balanced diet that includes fresh fruits and vegetables, whole grains, good sources of protein such as meat, eggs, or tofu, and low-fat dairy. Your health care provider will help you determine the amount of weight gain that is right for you.  Avoid raw meat and uncooked cheese. These carry germs that can cause birth defects in the baby.  If you have low calcium intake from food, talk to your health care provider about whether you should take a daily calcium supplement.  Limit foods that are high in fat and processed sugars, such as fried and sweet foods.  To prevent constipation: ? Drink enough fluid to keep your urine clear or pale yellow. ? Eat foods that are high in fiber, such as fresh fruits and vegetables, whole grains, and beans. Activity  Exercise only as directed by your health care provider. Most women can continue their usual exercise routine during pregnancy. Try to exercise for 30 minutes at least 5 days a week. Stop exercising if you experience uterine contractions.  Avoid heavy lifting, wear low heel shoes, and practice good posture.  A sexual relationship may be continued unless your health care provider directs you otherwise. Relieving pain and discomfort  Wear a good support bra to prevent discomfort from breast tenderness.  Take warm sitz baths to soothe any pain or discomfort caused by hemorrhoids. Use hemorrhoid cream if your health care provider approves.  Rest with your legs elevated if you have leg cramps or low back pain.  If you develop varicose veins, wear support hose. Elevate your feet for 15 minutes, 3-4 times a day. Limit salt in your diet. Prenatal Care  Write down your questions.  Take them to your prenatal visits.  Keep all your prenatal visits as told by your health care provider. This is important. Safety  Wear your seat belt at all times when driving.  Make a list of emergency phone numbers, including numbers for family, friends, the hospital, and police and fire departments. General instructions  Ask your health care provider for a referral to a local prenatal education class. Begin classes no later than the beginning of month 6 of your pregnancy.  Ask for help if you have counseling or nutritional needs during pregnancy. Your health care provider can offer advice or refer you to specialists for help with various needs.  Do not use hot tubs, steam rooms, or saunas.  Do not douche or use tampons or scented sanitary pads.  Do not cross your legs for long periods of time.  Avoid cat litter boxes and soil used by cats. These carry germs that can cause birth defects in the baby and possibly loss of the fetus by miscarriage or stillbirth.  Avoid all smoking, herbs, alcohol, and unprescribed drugs. Chemicals in these products   can affect the formation and growth of the baby.  Do not use any products that contain nicotine or tobacco, such as cigarettes and e-cigarettes. If you need help quitting, ask your health care provider.  Visit your dentist if you have not gone yet during your pregnancy. Use a soft toothbrush to brush your teeth and be gentle when you floss. Contact a health care provider if:  You have dizziness.  You have mild pelvic cramps, pelvic pressure, or nagging pain in the abdominal area.  You have persistent nausea, vomiting, or diarrhea.  You have a bad smelling vaginal discharge.  You have pain when you urinate. Get help right away if:  You have a fever.  You are leaking fluid from your vagina.  You have spotting or bleeding from your vagina.  You have severe abdominal cramping or pain.  You have rapid weight gain or weight  loss.  You have shortness of breath with chest pain.  You notice sudden or extreme swelling of your face, hands, ankles, feet, or legs.  You have not felt your baby move in over an hour.  You have severe headaches that do not go away when you take medicine.  You have vision changes. Summary  The second trimester is from week 14 through week 27 (months 4 through 6). It is also a time when the fetus is growing rapidly.  Your body goes through many changes during pregnancy. The changes vary from woman to woman.  Avoid all smoking, herbs, alcohol, and unprescribed drugs. These chemicals affect the formation and growth your baby.  Do not use any tobacco products, such as cigarettes, chewing tobacco, and e-cigarettes. If you need help quitting, ask your health care provider.  Contact your health care provider if you have any questions. Keep all prenatal visits as told by your health care provider. This is important. This information is not intended to replace advice given to you by your health care provider. Make sure you discuss any questions you have with your health care provider. Document Released: 04/17/2001 Document Revised: 09/29/2015 Document Reviewed: 06/24/2012 [employer] Interactive Patient Education  2017 [employer]   Breastfeeding Deciding to breastfeed is one of the best choices you can make for you and your baby. A change in hormones during pregnancy causes your breast tissue to grow and increases the number and size of your milk ducts. These hormones also allow proteins, sugars, and fats from your blood supply to make breast milk in your milk-producing glands. Hormones prevent breast milk from being released before your baby is born as well as prompt milk flow after birth. Once breastfeeding has begun, thoughts of your baby, as well as his or her sucking or crying, can stimulate the release of milk from your milk-producing glands. Benefits of breastfeeding For Your  Baby  Your first milk (colostrum) helps your baby's digestive system function better.  There are antibodies in your milk that help your baby fight off infections.  Your baby has a lower incidence of asthma, allergies, and sudden infant death syndrome.  The nutrients in breast milk are better for your baby than infant formulas and are designed uniquely for your baby's needs.  Breast milk improves your baby's brain development.  Your baby is less likely to develop other conditions, such as childhood obesity, asthma, or type 2 diabetes mellitus.  For You  Breastfeeding helps to create a very special bond between you and your baby.  Breastfeeding is convenient. Breast milk is always available at   the correct temperature and costs nothing.  Breastfeeding helps to burn calories and helps you lose the weight gained during pregnancy.  Breastfeeding makes your uterus contract to its prepregnancy size faster and slows bleeding (lochia) after you give birth.  Breastfeeding helps to lower your risk of developing type 2 diabetes mellitus, osteoporosis, and breast or ovarian cancer later in life.  Signs that your baby is hungry Early Signs of Hunger  Increased alertness or activity.  Stretching.  Movement of the head from side to side.  Movement of the head and opening of the mouth when the corner of the mouth or cheek is stroked (rooting).  Increased sucking sounds, smacking lips, cooing, sighing, or squeaking.  Hand-to-mouth movements.  Increased sucking of fingers or hands.  Late Signs of Hunger  Fussing.  Intermittent crying.  Extreme Signs of Hunger Signs of extreme hunger will require calming and consoling before your baby will be able to breastfeed successfully. Do not wait for the following signs of extreme hunger to occur before you initiate breastfeeding:  Restlessness.  A loud, strong cry.  Screaming.  Breastfeeding basics Breastfeeding Initiation  Find a  comfortable place to sit or lie down, with your neck and back well supported.  Place a pillow or rolled up blanket under your baby to bring him or her to the level of your breast (if you are seated). Nursing pillows are specially designed to help support your arms and your baby while you breastfeed.  Make sure that your baby's abdomen is facing your abdomen.  Gently massage your breast. With your fingertips, massage from your chest wall toward your nipple in a circular motion. This encourages milk flow. You may need to continue this action during the feeding if your milk flows slowly.  Support your breast with 4 fingers underneath and your thumb above your nipple. Make sure your fingers are well away from your nipple and your baby's mouth.  Stroke your baby's lips gently with your finger or nipple.  When your baby's mouth is open wide enough, quickly bring your baby to your breast, placing your entire nipple and as much of the colored area around your nipple (areola) as possible into your baby's mouth. ? More areola should be visible above your baby's upper lip than below the lower lip. ? Your baby's tongue should be between his or her lower gum and your breast.  Ensure that your baby's mouth is correctly positioned around your nipple (latched). Your baby's lips should create a seal on your breast and be turned out (everted).  It is common for your baby to suck about 2-3 minutes in order to start the flow of breast milk.  Latching Teaching your baby how to latch on to your breast properly is very important. An improper latch can cause nipple pain and decreased milk supply for you and poor weight gain in your baby. Also, if your baby is not latched onto your nipple properly, he or she may swallow some air during feeding. This can make your baby fussy. Burping your baby when you switch breasts during the feeding can help to get rid of the air. However, teaching your baby to latch on properly is  still the best way to prevent fussiness from swallowing air while breastfeeding. Signs that your baby has successfully latched on to your nipple:  Silent tugging or silent sucking, without causing you pain.  Swallowing heard between every 3-4 sucks.  Muscle movement above and in front of his or her   ears while sucking.  Signs that your baby has not successfully latched on to nipple:  Sucking sounds or smacking sounds from your baby while breastfeeding.  Nipple pain.  If you think your baby has not latched on correctly, slip your finger into the corner of your baby's mouth to break the suction and place it between your baby's gums. Attempt breastfeeding initiation again. Signs of Successful Breastfeeding Signs from your baby:  A gradual decrease in the number of sucks or complete cessation of sucking.  Falling asleep.  Relaxation of his or her body.  Retention of a small amount of milk in his or her mouth.  Letting go of your breast by himself or herself.  Signs from you:  Breasts that have increased in firmness, weight, and size 1-3 hours after feeding.  Breasts that are softer immediately after breastfeeding.  Increased milk volume, as well as a change in milk consistency and color by the fifth day of breastfeeding.  Nipples that are not sore, cracked, or bleeding.  Signs That Your Baby is Getting Enough Milk  Wetting at least 1-2 diapers during the first 24 hours after birth.  Wetting at least 5-6 diapers every 24 hours for the first week after birth. The urine should be clear or pale yellow by 5 days after birth.  Wetting 6-8 diapers every 24 hours as your baby continues to grow and develop.  At least 3 stools in a 24-hour period by age 5 days. The stool should be soft and yellow.  At least 3 stools in a 24-hour period by age 7 days. The stool should be seedy and yellow.  No loss of weight greater than 10% of birth weight during the first 3 days of age.  Average  weight gain of 4-7 ounces (113-198 g) per week after age 4 days.  Consistent daily weight gain by age 5 days, without weight loss after the age of 2 weeks.  After a feeding, your baby may spit up a small amount. This is common. Breastfeeding frequency and duration Frequent feeding will help you make more milk and can prevent sore nipples and breast engorgement. Breastfeed when you feel the need to reduce the fullness of your breasts or when your baby shows signs of hunger. This is called "breastfeeding on demand." Avoid introducing a pacifier to your baby while you are working to establish breastfeeding (the first 4-6 weeks after your baby is born). After this time you may choose to use a pacifier. Research has shown that pacifier use during the first year of a baby's life decreases the risk of sudden infant death syndrome (SIDS). Allow your baby to feed on each breast as long as he or she wants. Breastfeed until your baby is finished feeding. When your baby unlatches or falls asleep while feeding from the first breast, offer the second breast. Because newborns are often sleepy in the first few weeks of life, you may need to awaken your baby to get him or her to feed. Breastfeeding times will vary from baby to baby. However, the following rules can serve as a guide to help you ensure that your baby is properly fed:  Newborns (babies 4 weeks of age or younger) may breastfeed every 1-3 hours.  Newborns should not go longer than 3 hours during the day or 5 hours during the night without breastfeeding.  You should breastfeed your baby a minimum of 8 times in a 24-hour period until you begin to introduce solid foods to your   baby at around 6 months of age.  Breast milk pumping Pumping and storing breast milk allows you to ensure that your baby is exclusively fed your breast milk, even at times when you are unable to breastfeed. This is especially important if you are going back to work while you are still  breastfeeding or when you are not able to be present during feedings. Your lactation consultant can give you guidelines on how long it is safe to store breast milk. A breast pump is a machine that allows you to pump milk from your breast into a sterile bottle. The pumped breast milk can then be stored in a refrigerator or freezer. Some breast pumps are operated by hand, while others use electricity. Ask your lactation consultant which type will work best for you. Breast pumps can be purchased, but some hospitals and breastfeeding support groups lease breast pumps on a monthly basis. A lactation consultant can teach you how to hand express breast milk, if you prefer not to use a pump. Caring for your breasts while you breastfeed Nipples can become dry, cracked, and sore while breastfeeding. The following recommendations can help keep your breasts moisturized and healthy:  Avoid using soap on your nipples.  Wear a supportive bra. Although not required, special nursing bras and tank tops are designed to allow access to your breasts for breastfeeding without taking off your entire bra or top. Avoid wearing underwire-style bras or extremely tight bras.  Air dry your nipples for 3-4minutes after each feeding.  Use only cotton bra pads to absorb leaked breast milk. Leaking of breast milk between feedings is normal.  Use lanolin on your nipples after breastfeeding. Lanolin helps to maintain your skin's normal moisture barrier. If you use pure lanolin, you do not need to wash it off before feeding your baby again. Pure lanolin is not toxic to your baby. You may also hand express a few drops of breast milk and gently massage that milk into your nipples and allow the milk to air dry.  In the first few weeks after giving birth, some women experience extremely full breasts (engorgement). Engorgement can make your breasts feel heavy, warm, and tender to the touch. Engorgement peaks within 3-5 days after you give  birth. The following recommendations can help ease engorgement:  Completely empty your breasts while breastfeeding or pumping. You may want to start by applying warm, moist heat (in the shower or with warm water-soaked hand towels) just before feeding or pumping. This increases circulation and helps the milk flow. If your baby does not completely empty your breasts while breastfeeding, pump any extra milk after he or she is finished.  Wear a snug bra (nursing or regular) or tank top for 1-2 days to signal your body to slightly decrease milk production.  Apply ice packs to your breasts, unless this is too uncomfortable for you.  Make sure that your baby is latched on and positioned properly while breastfeeding.  If engorgement persists after 48 hours of following these recommendations, contact your health care provider or a lactation consultant. Overall health care recommendations while breastfeeding  Eat healthy foods. Alternate between meals and snacks, eating 3 of each per day. Because what you eat affects your breast milk, some of the foods may make your baby more irritable than usual. Avoid eating these foods if you are sure that they are negatively affecting your baby.  Drink milk, fruit juice, and water to satisfy your thirst (about 10 glasses a day).    your prenatal vitamins to prevent fatigue, stress, and anemia.  Continue breast self-awareness checks.  Avoid chewing and smoking tobacco. Chemicals from cigarettes that pass into breast milk and exposure to secondhand smoke may harm your baby.  Avoid alcohol and drug use, including marijuana. Some medicines that may be harmful to your baby can pass through breast milk. It is important to ask your health care provider before taking any medicine, including all over-the-counter and prescription medicine as well as vitamin and herbal supplements. It is possible to become pregnant while breastfeeding. If birth control is  desired, ask your health care provider about options that will be safe for your baby. Contact a health care provider if:  You feel like you want to stop breastfeeding or have become frustrated with breastfeeding.  You have painful breasts or nipples.  Your nipples are cracked or bleeding.  Your breasts are red, tender, or warm.  You have a swollen area on either breast.  You have a fever or chills.  You have nausea or vomiting.  You have drainage other than breast milk from your nipples.  Your breasts do not become full before feedings by the fifth day after you give birth.  You feel sad and depressed.  Your baby is too sleepy to eat well.  Your baby is having trouble sleeping.  Your baby is wetting less than 3 diapers in a 24-hour period.  Your baby has less than 3 stools in a 24-hour period.  Your baby's skin or the white part of his or her eyes becomes yellow.  Your baby is not gaining weight by 29 days of age. Get help right away if:  Your baby is overly tired (lethargic) and does not want to wake up and feed.  Your baby develops an unexplained fever. This information is not intended to replace advice given to you by your health care provider. Make sure you discuss any questions you have with your health care provider. Document Released: 04/23/2005 Document Revised: 10/05/2015 Document Reviewed: 10/15/2012 [employer] Interactive Patient Education  2017 [employer]  Cord Blood Banking Information Cord blood banking is the process of collecting and storing the blood that is in the umbilical cord and placenta at the time of delivery. This blood contains stem cells, which can be used to treat many blood diseases, immune system disorders, and childhood cancers. Stem cells can also be used to research certain diseases and treatments. Many people who choose cord blood banking donate the blood. Donated blood can be used in lifesaving treatments or for research. Other people  choose to store the blood privately. Blood that is stored privately can only be used with the person's permission. This option is often chosen if:  A family member needs a stem cell transplant.  The child is part of an ethnic minority.  The child was conceived through in vitro fertilization. What should I look for in a blood bank? A blood bank is the organization that coordinates cord blood banking. Make sure the cord blood bank that you use:  Is accredited.  Is financially stable.  Handles a large volume of cord blood samples.  Has a procedure in place for transport and storage.  Allows you the option of transferring your cord blood sample.  Has a procedure in place if the bank goes out of business.  Clearly states all costs and limits to future costs. People who choose to donate cord blood should not need to pay for blood banking. People who keep the  blood for private use will need to pay for the first (initial) storage and pay a fee each year (annual fee). Other fees may also apply. What are the risks of cord blood banking? There are no health risks associated with cord blood banking. It is considered safe. How should I prepare? You must schedule this process at least 4-6 weeks before you will be giving birth. How is the blood collected? The blood is collected as soon as the baby has been delivered. Within 15 minutes of delivery, a health care provider will take these actions to collect the blood:  Clamp the umbilical cord at the top and bottom. This traps the blood in the umbilical cord.  Use a syringe or bag to collect the blood.  Insert needles into the placenta to collect (draw out) more blood. What happens after the blood is collected? After the blood has been collected:  The blood will be sent to a blood bank.  The blood will be tested for genetic problems and infectious diseases. If the blood tests positive for a genetic problem or a disease, someone will contact  you and let you know.  The blood will be frozen. If your child develops a genetic condition, immune system disorder, or cancer, you will be responsible for contacting the blood bank and letting them know. This information is not intended to replace advice given to you by your health care provider. Make sure you discuss any questions you have with your health care provider. Document Released: 10/11/2009 Document Revised: 09/29/2015 Document Reviewed: 10/11/2014 Elsevier Interactive Patient Education  2017 [employer]

## 2016-07-24 NOTE — Progress Notes (Signed)
   PRENATAL VISIT NOTE  Subjective:  Lisa Landry is a 41 y.o. G3P2002 at [redacted]w[redacted]d being seen today for ongoing prenatal care.  She is currently monitored for the following issues for this high-risk pregnancy and has Supervision of high risk pregnancy, antepartum; Multigravida of advanced maternal age; and GBS bacteriuria on her problem list.  Patient reports no complaints.  Contractions: Not present. Vag. Bleeding: None.  Movement: Absent. Denies leaking of fluid.   The following portions of the patient's history were reviewed and updated as appropriate: allergies, current medications, past family history, past medical history, past social history, past surgical history and problem list. Problem list updated.  Objective:   Vitals:   07/24/16 1622  BP: 113/74  Pulse: 88  Weight: 154 lb (69.9 kg)    Fetal Status: Fetal Heart Rate (bpm): 148   Movement: Absent     General:  Alert, oriented and cooperative. Patient is in no acute distress.  Skin: Skin is warm and dry. No rash noted.   Cardiovascular: Normal heart rate noted  Respiratory: Normal respiratory effort, no problems with respiration noted  Abdomen: Soft, gravid, appropriate for gestational age. Pain/Pressure: Absent     Pelvic:  Cervical exam deferred        Extremities: Normal range of motion.  Edema: None  Mental Status: Normal mood and affect. Normal behavior. Normal judgment and thought content.   Assessment and Plan:  Pregnancy: G3P2002 at [redacted]w[redacted]d  1. Supervision of high risk pregnancy, antepartum Continue routine prenatal care. Has anatomy scheduled next week.  2. Elderly multigravida in second trimester Normal NIPT  General obstetric precautions including but not limited to vaginal bleeding, contractions, leaking of fluid and fetal movement were reviewed in detail with the patient. Please refer to After Visit Summary for other counseling recommendations.  Return in 4 weeks (on 08/21/2016).   Reva Boresanya S Pratt,  MD

## 2016-07-31 ENCOUNTER — Ambulatory Visit (HOSPITAL_COMMUNITY)
Admission: RE | Admit: 2016-07-31 | Discharge: 2016-07-31 | Disposition: A | Discharge status: Home / Self Care | Payer: BLUE CROSS/BLUE SHIELD | Source: Ambulatory Visit | Attending: Obstetrics & Gynecology | Admitting: Obstetrics & Gynecology

## 2016-07-31 ENCOUNTER — Other Ambulatory Visit: Payer: Self-pay | Admitting: Obstetrics & Gynecology

## 2016-07-31 ENCOUNTER — Encounter (HOSPITAL_COMMUNITY): Payer: Self-pay

## 2016-07-31 DIAGNOSIS — O09529 Supervision of elderly multigravida, unspecified trimester: Secondary | ICD-10-CM

## 2016-07-31 DIAGNOSIS — O09522 Supervision of elderly multigravida, second trimester: Secondary | ICD-10-CM

## 2016-07-31 DIAGNOSIS — Z3A18 18 weeks gestation of pregnancy: Secondary | ICD-10-CM | POA: Insufficient documentation

## 2016-07-31 DIAGNOSIS — Z3A19 19 weeks gestation of pregnancy: Secondary | ICD-10-CM

## 2016-07-31 DIAGNOSIS — Z363 Encounter for antenatal screening for malformations: Secondary | ICD-10-CM | POA: Insufficient documentation

## 2016-07-31 DIAGNOSIS — O0990 Supervision of high risk pregnancy, unspecified, unspecified trimester: Secondary | ICD-10-CM

## 2016-07-31 DIAGNOSIS — Z3689 Encounter for other specified antenatal screening: Secondary | ICD-10-CM

## 2016-07-31 DIAGNOSIS — R8271 Bacteriuria: Secondary | ICD-10-CM

## 2016-07-31 HISTORY — DX: Unspecified abnormal cytological findings in specimens from vagina: R87.629

## 2016-08-02 ENCOUNTER — Other Ambulatory Visit (HOSPITAL_COMMUNITY): Payer: Self-pay | Admitting: *Deleted

## 2016-08-02 DIAGNOSIS — O09522 Supervision of elderly multigravida, second trimester: Secondary | ICD-10-CM

## 2016-08-21 ENCOUNTER — Ambulatory Visit (INDEPENDENT_AMBULATORY_CARE_PROVIDER_SITE_OTHER): Payer: BC Managed Care – PPO | Admitting: Obstetrics & Gynecology

## 2016-08-21 VITALS — BP 96/62 | HR 92 | Wt 159.0 lb

## 2016-08-21 DIAGNOSIS — O09522 Supervision of elderly multigravida, second trimester: Secondary | ICD-10-CM

## 2016-08-21 DIAGNOSIS — O0990 Supervision of high risk pregnancy, unspecified, unspecified trimester: Secondary | ICD-10-CM

## 2016-08-21 DIAGNOSIS — O0992 Supervision of high risk pregnancy, unspecified, second trimester: Secondary | ICD-10-CM

## 2016-08-21 DIAGNOSIS — O09529 Supervision of elderly multigravida, unspecified trimester: Secondary | ICD-10-CM

## 2016-08-21 NOTE — Patient Instructions (Signed)
Return to clinic for any scheduled appointments or obstetric concerns, or go to [facility] for evaluation  

## 2016-08-21 NOTE — Progress Notes (Signed)
   PRENATAL VISIT NOTE  Subjective:  Lisa Landry is a 41 y.o. G3P2002 at [redacted]w[redacted]d being seen today for ongoing prenatal care.  She is currently monitored for the following issues for this high-risk pregnancy and has Supervision of high risk pregnancy, antepartum; Multigravida of advanced maternal age; and GBS bacteriuria on her problem list.  Patient reports no complaints.  Contractions: Not present. Vag. Bleeding: None.  Movement: Absent. Denies leaking of fluid.   The following portions of the patient's history were reviewed and updated as appropriate: allergies, current medications, past family history, past medical history, past social history, past surgical history and problem list. Problem list updated.  Objective:   Vitals:   08/21/16 1614  BP: 96/62  Pulse: 92  Weight: 159 lb (72.1 kg)    Fetal Status: Fetal Heart Rate (bpm): 151 Fundal Height: 22 cm Movement: Absent     General:  Alert, oriented and cooperative. Patient is in no acute distress.  Skin: Skin is warm and dry. No rash noted.   Cardiovascular: Normal heart rate noted  Respiratory: Normal respiratory effort, no problems with respiration noted  Abdomen: Soft, gravid, appropriate for gestational age. Pain/Pressure: Absent     Pelvic:  Cervical exam deferred        Extremities: Normal range of motion.  Edema: None  Mental Status: Normal mood and affect. Normal behavior. Normal judgment and thought content.   Assessment and Plan:  Pregnancy: G3P2002 at [redacted]w[redacted]d  1. Antepartum multigravida of advanced maternal age 58. Supervision of high risk pregnancy, antepartum Scheduled for ultrasound on 09/11/2016. Preterm labor symptoms and general obstetric precautions including but not limited to vaginal bleeding, contractions, leaking of fluid and fetal movement were reviewed in detail with the patient. Please refer to After Visit Summary for other counseling recommendations.  Return in about 4 weeks (around 09/18/2016) for OB  Visit.   Tereso Newcomer, MD

## 2016-09-11 ENCOUNTER — Other Ambulatory Visit (HOSPITAL_COMMUNITY): Payer: Self-pay | Admitting: Maternal and Fetal Medicine

## 2016-09-11 ENCOUNTER — Ambulatory Visit (HOSPITAL_COMMUNITY)
Admission: RE | Admit: 2016-09-11 | Discharge: 2016-09-11 | Disposition: A | Discharge status: Home / Self Care | Payer: BC Managed Care – PPO | Source: Ambulatory Visit | Attending: Obstetrics & Gynecology | Admitting: Obstetrics & Gynecology

## 2016-09-11 DIAGNOSIS — Z3A24 24 weeks gestation of pregnancy: Secondary | ICD-10-CM | POA: Diagnosis not present

## 2016-09-11 DIAGNOSIS — Z362 Encounter for other antenatal screening follow-up: Secondary | ICD-10-CM | POA: Insufficient documentation

## 2016-09-11 DIAGNOSIS — O09522 Supervision of elderly multigravida, second trimester: Secondary | ICD-10-CM | POA: Diagnosis present

## 2016-09-12 ENCOUNTER — Other Ambulatory Visit (HOSPITAL_COMMUNITY): Payer: Self-pay | Admitting: *Deleted

## 2016-09-12 DIAGNOSIS — O09523 Supervision of elderly multigravida, third trimester: Secondary | ICD-10-CM

## 2016-09-18 ENCOUNTER — Ambulatory Visit (INDEPENDENT_AMBULATORY_CARE_PROVIDER_SITE_OTHER): Payer: BC Managed Care – PPO | Admitting: Family Medicine

## 2016-09-18 VITALS — BP 127/71 | HR 94 | Wt 163.0 lb

## 2016-09-18 DIAGNOSIS — O09522 Supervision of elderly multigravida, second trimester: Secondary | ICD-10-CM

## 2016-09-18 DIAGNOSIS — O0992 Supervision of high risk pregnancy, unspecified, second trimester: Secondary | ICD-10-CM

## 2016-09-18 DIAGNOSIS — O0990 Supervision of high risk pregnancy, unspecified, unspecified trimester: Secondary | ICD-10-CM

## 2016-09-18 NOTE — Patient Instructions (Signed)
 Second Trimester of Pregnancy The second trimester is from week 14 through week 27 (months 4 through 6). The second trimester is often a time when you feel your best. Your body has adjusted to being pregnant, and you begin to feel better physically. Usually, morning sickness has lessened or quit completely, you may have more energy, and you may have an increase in appetite. The second trimester is also a time when the fetus is growing rapidly. At the end of the sixth month, the fetus is about 9 inches long and weighs about 1 pounds. You will likely begin to feel the baby move (quickening) between 16 and 20 weeks of pregnancy. Body changes during your second trimester Your body continues to go through many changes during your second trimester. The changes vary from woman to woman.  Your weight will continue to increase. You will notice your lower abdomen bulging out.  You may begin to get stretch marks on your hips, abdomen, and breasts.  You may develop headaches that can be relieved by medicines. The medicines should be approved by your health care provider.  You may urinate more often because the fetus is pressing on your bladder.  You may develop or continue to have heartburn as a result of your pregnancy.  You may develop constipation because certain hormones are causing the muscles that push waste through your intestines to slow down.  You may develop hemorrhoids or swollen, bulging veins (varicose veins).  You may have back pain. This is caused by: ? Weight gain. ? Pregnancy hormones that are relaxing the joints in your pelvis. ? A shift in weight and the muscles that support your balance.  Your breasts will continue to grow and they will continue to become tender.  Your gums may bleed and may be sensitive to brushing and flossing.  Dark spots or blotches (chloasma, mask of pregnancy) may develop on your face. This will likely fade after the baby is born.  A dark line from  your belly button to the pubic area (linea nigra) may appear. This will likely fade after the baby is born.  You may have changes in your hair. These can include thickening of your hair, rapid growth, and changes in texture. Some women also have hair loss during or after pregnancy, or hair that feels dry or thin. Your hair will most likely return to normal after your baby is born.  What to expect at prenatal visits During a routine prenatal visit:  You will be weighed to make sure you and the fetus are growing normally.  Your blood pressure will be taken.  Your abdomen will be measured to track your baby's growth.  The fetal heartbeat will be listened to.  Any test results from the previous visit will be discussed.  Your health care provider may ask you:  How you are feeling.  If you are feeling the baby move.  If you have had any abnormal symptoms, such as leaking fluid, bleeding, severe headaches, or abdominal cramping.  If you are using any tobacco products, including cigarettes, chewing tobacco, and electronic cigarettes.  If you have any questions.  Other tests that may be performed during your second trimester include:  Blood tests that check for: ? Low iron levels (anemia). ? High blood sugar that affects pregnant women (gestational diabetes) between 24 and 28 weeks. ? Rh antibodies. This is to check for a protein on red blood cells (Rh factor).  Urine tests to check for infections, diabetes,   or protein in the urine.  An ultrasound to confirm the proper growth and development of the baby.  An amniocentesis to check for possible genetic problems.  Fetal screens for spina bifida and Down syndrome.  HIV (human immunodeficiency virus) testing. Routine prenatal testing includes screening for HIV, unless you choose not to have this test.  Follow these instructions at home: Medicines  Follow your health care provider's instructions regarding medicine use. Specific  medicines may be either safe or unsafe to take during pregnancy.  Take a prenatal vitamin that contains at least 600 micrograms (mcg) of folic acid.  If you develop constipation, try taking a stool softener if your health care provider approves. Eating and drinking  Eat a balanced diet that includes fresh fruits and vegetables, whole grains, good sources of protein such as meat, eggs, or tofu, and low-fat dairy. Your health care provider will help you determine the amount of weight gain that is right for you.  Avoid raw meat and uncooked cheese. These carry germs that can cause birth defects in the baby.  If you have low calcium intake from food, talk to your health care provider about whether you should take a daily calcium supplement.  Limit foods that are high in fat and processed sugars, such as fried and sweet foods.  To prevent constipation: ? Drink enough fluid to keep your urine clear or pale yellow. ? Eat foods that are high in fiber, such as fresh fruits and vegetables, whole grains, and beans. Activity  Exercise only as directed by your health care provider. Most women can continue their usual exercise routine during pregnancy. Try to exercise for 30 minutes at least 5 days a week. Stop exercising if you experience uterine contractions.  Avoid heavy lifting, wear low heel shoes, and practice good posture.  A sexual relationship may be continued unless your health care provider directs you otherwise. Relieving pain and discomfort  Wear a good support bra to prevent discomfort from breast tenderness.  Take warm sitz baths to soothe any pain or discomfort caused by hemorrhoids. Use hemorrhoid cream if your health care provider approves.  Rest with your legs elevated if you have leg cramps or low back pain.  If you develop varicose veins, wear support hose. Elevate your feet for 15 minutes, 3-4 times a day. Limit salt in your diet. Prenatal Care  Write down your questions.  Take them to your prenatal visits.  Keep all your prenatal visits as told by your health care provider. This is important. Safety  Wear your seat belt at all times when driving.  Make a list of emergency phone numbers, including numbers for family, friends, the hospital, and police and fire departments. General instructions  Ask your health care provider for a referral to a local prenatal education class. Begin classes no later than the beginning of month 6 of your pregnancy.  Ask for help if you have counseling or nutritional needs during pregnancy. Your health care provider can offer advice or refer you to specialists for help with various needs.  Do not use hot tubs, steam rooms, or saunas.  Do not douche or use tampons or scented sanitary pads.  Do not cross your legs for long periods of time.  Avoid cat litter boxes and soil used by cats. These carry germs that can cause birth defects in the baby and possibly loss of the fetus by miscarriage or stillbirth.  Avoid all smoking, herbs, alcohol, and unprescribed drugs. Chemicals in these products   can affect the formation and growth of the baby.  Do not use any products that contain nicotine or tobacco, such as cigarettes and e-cigarettes. If you need help quitting, ask your health care provider.  Visit your dentist if you have not gone yet during your pregnancy. Use a soft toothbrush to brush your teeth and be gentle when you floss. Contact a health care provider if:  You have dizziness.  You have mild pelvic cramps, pelvic pressure, or nagging pain in the abdominal area.  You have persistent nausea, vomiting, or diarrhea.  You have a bad smelling vaginal discharge.  You have pain when you urinate. Get help right away if:  You have a fever.  You are leaking fluid from your vagina.  You have spotting or bleeding from your vagina.  You have severe abdominal cramping or pain.  You have rapid weight gain or weight  loss.  You have shortness of breath with chest pain.  You notice sudden or extreme swelling of your face, hands, ankles, feet, or legs.  You have not felt your baby move in over an hour.  You have severe headaches that do not go away when you take medicine.  You have vision changes. Summary  The second trimester is from week 14 through week 27 (months 4 through 6). It is also a time when the fetus is growing rapidly.  Your body goes through many changes during pregnancy. The changes vary from woman to woman.  Avoid all smoking, herbs, alcohol, and unprescribed drugs. These chemicals affect the formation and growth your baby.  Do not use any tobacco products, such as cigarettes, chewing tobacco, and e-cigarettes. If you need help quitting, ask your health care provider.  Contact your health care provider if you have any questions. Keep all prenatal visits as told by your health care provider. This is important. This information is not intended to replace advice given to you by your health care provider. Make sure you discuss any questions you have with your health care provider. Document Released: 04/17/2001 Document Revised: 09/29/2015 Document Reviewed: 06/24/2012 [employer] Interactive Patient Education  2017 [employer]   Breastfeeding Deciding to breastfeed is one of the best choices you can make for you and your baby. A change in hormones during pregnancy causes your breast tissue to grow and increases the number and size of your milk ducts. These hormones also allow proteins, sugars, and fats from your blood supply to make breast milk in your milk-producing glands. Hormones prevent breast milk from being released before your baby is born as well as prompt milk flow after birth. Once breastfeeding has begun, thoughts of your baby, as well as his or her sucking or crying, can stimulate the release of milk from your milk-producing glands. Benefits of breastfeeding For Your  Baby  Your first milk (colostrum) helps your baby's digestive system function better.  There are antibodies in your milk that help your baby fight off infections.  Your baby has a lower incidence of asthma, allergies, and sudden infant death syndrome.  The nutrients in breast milk are better for your baby than infant formulas and are designed uniquely for your baby's needs.  Breast milk improves your baby's brain development.  Your baby is less likely to develop other conditions, such as childhood obesity, asthma, or type 2 diabetes mellitus.  For You  Breastfeeding helps to create a very special bond between you and your baby.  Breastfeeding is convenient. Breast milk is always available at   the correct temperature and costs nothing.  Breastfeeding helps to burn calories and helps you lose the weight gained during pregnancy.  Breastfeeding makes your uterus contract to its prepregnancy size faster and slows bleeding (lochia) after you give birth.  Breastfeeding helps to lower your risk of developing type 2 diabetes mellitus, osteoporosis, and breast or ovarian cancer later in life.  Signs that your baby is hungry Early Signs of Hunger  Increased alertness or activity.  Stretching.  Movement of the head from side to side.  Movement of the head and opening of the mouth when the corner of the mouth or cheek is stroked (rooting).  Increased sucking sounds, smacking lips, cooing, sighing, or squeaking.  Hand-to-mouth movements.  Increased sucking of fingers or hands.  Late Signs of Hunger  Fussing.  Intermittent crying.  Extreme Signs of Hunger Signs of extreme hunger will require calming and consoling before your baby will be able to breastfeed successfully. Do not wait for the following signs of extreme hunger to occur before you initiate breastfeeding:  Restlessness.  A loud, strong cry.  Screaming.  Breastfeeding basics Breastfeeding Initiation  Find a  comfortable place to sit or lie down, with your neck and back well supported.  Place a pillow or rolled up blanket under your baby to bring him or her to the level of your breast (if you are seated). Nursing pillows are specially designed to help support your arms and your baby while you breastfeed.  Make sure that your baby's abdomen is facing your abdomen.  Gently massage your breast. With your fingertips, massage from your chest wall toward your nipple in a circular motion. This encourages milk flow. You may need to continue this action during the feeding if your milk flows slowly.  Support your breast with 4 fingers underneath and your thumb above your nipple. Make sure your fingers are well away from your nipple and your baby's mouth.  Stroke your baby's lips gently with your finger or nipple.  When your baby's mouth is open wide enough, quickly bring your baby to your breast, placing your entire nipple and as much of the colored area around your nipple (areola) as possible into your baby's mouth. ? More areola should be visible above your baby's upper lip than below the lower lip. ? Your baby's tongue should be between his or her lower gum and your breast.  Ensure that your baby's mouth is correctly positioned around your nipple (latched). Your baby's lips should create a seal on your breast and be turned out (everted).  It is common for your baby to suck about 2-3 minutes in order to start the flow of breast milk.  Latching Teaching your baby how to latch on to your breast properly is very important. An improper latch can cause nipple pain and decreased milk supply for you and poor weight gain in your baby. Also, if your baby is not latched onto your nipple properly, he or she may swallow some air during feeding. This can make your baby fussy. Burping your baby when you switch breasts during the feeding can help to get rid of the air. However, teaching your baby to latch on properly is  still the best way to prevent fussiness from swallowing air while breastfeeding. Signs that your baby has successfully latched on to your nipple:  Silent tugging or silent sucking, without causing you pain.  Swallowing heard between every 3-4 sucks.  Muscle movement above and in front of his or her   ears while sucking.  Signs that your baby has not successfully latched on to nipple:  Sucking sounds or smacking sounds from your baby while breastfeeding.  Nipple pain.  If you think your baby has not latched on correctly, slip your finger into the corner of your baby's mouth to break the suction and place it between your baby's gums. Attempt breastfeeding initiation again. Signs of Successful Breastfeeding Signs from your baby:  A gradual decrease in the number of sucks or complete cessation of sucking.  Falling asleep.  Relaxation of his or her body.  Retention of a small amount of milk in his or her mouth.  Letting go of your breast by himself or herself.  Signs from you:  Breasts that have increased in firmness, weight, and size 1-3 hours after feeding.  Breasts that are softer immediately after breastfeeding.  Increased milk volume, as well as a change in milk consistency and color by the fifth day of breastfeeding.  Nipples that are not sore, cracked, or bleeding.  Signs That Your Baby is Getting Enough Milk  Wetting at least 1-2 diapers during the first 24 hours after birth.  Wetting at least 5-6 diapers every 24 hours for the first week after birth. The urine should be clear or pale yellow by 5 days after birth.  Wetting 6-8 diapers every 24 hours as your baby continues to grow and develop.  At least 3 stools in a 24-hour period by age 5 days. The stool should be soft and yellow.  At least 3 stools in a 24-hour period by age 7 days. The stool should be seedy and yellow.  No loss of weight greater than 10% of birth weight during the first 3 days of age.  Average  weight gain of 4-7 ounces (113-198 g) per week after age 4 days.  Consistent daily weight gain by age 5 days, without weight loss after the age of 2 weeks.  After a feeding, your baby may spit up a small amount. This is common. Breastfeeding frequency and duration Frequent feeding will help you make more milk and can prevent sore nipples and breast engorgement. Breastfeed when you feel the need to reduce the fullness of your breasts or when your baby shows signs of hunger. This is called "breastfeeding on demand." Avoid introducing a pacifier to your baby while you are working to establish breastfeeding (the first 4-6 weeks after your baby is born). After this time you may choose to use a pacifier. Research has shown that pacifier use during the first year of a baby's life decreases the risk of sudden infant death syndrome (SIDS). Allow your baby to feed on each breast as long as he or she wants. Breastfeed until your baby is finished feeding. When your baby unlatches or falls asleep while feeding from the first breast, offer the second breast. Because newborns are often sleepy in the first few weeks of life, you may need to awaken your baby to get him or her to feed. Breastfeeding times will vary from baby to baby. However, the following rules can serve as a guide to help you ensure that your baby is properly fed:  Newborns (babies 4 weeks of age or younger) may breastfeed every 1-3 hours.  Newborns should not go longer than 3 hours during the day or 5 hours during the night without breastfeeding.  You should breastfeed your baby a minimum of 8 times in a 24-hour period until you begin to introduce solid foods to your   baby at around 6 months of age.  Breast milk pumping Pumping and storing breast milk allows you to ensure that your baby is exclusively fed your breast milk, even at times when you are unable to breastfeed. This is especially important if you are going back to work while you are still  breastfeeding or when you are not able to be present during feedings. Your lactation consultant can give you guidelines on how long it is safe to store breast milk. A breast pump is a machine that allows you to pump milk from your breast into a sterile bottle. The pumped breast milk can then be stored in a refrigerator or freezer. Some breast pumps are operated by hand, while others use electricity. Ask your lactation consultant which type will work best for you. Breast pumps can be purchased, but some hospitals and breastfeeding support groups lease breast pumps on a monthly basis. A lactation consultant can teach you how to hand express breast milk, if you prefer not to use a pump. Caring for your breasts while you breastfeed Nipples can become dry, cracked, and sore while breastfeeding. The following recommendations can help keep your breasts moisturized and healthy:  Avoid using soap on your nipples.  Wear a supportive bra. Although not required, special nursing bras and tank tops are designed to allow access to your breasts for breastfeeding without taking off your entire bra or top. Avoid wearing underwire-style bras or extremely tight bras.  Air dry your nipples for 3-4minutes after each feeding.  Use only cotton bra pads to absorb leaked breast milk. Leaking of breast milk between feedings is normal.  Use lanolin on your nipples after breastfeeding. Lanolin helps to maintain your skin's normal moisture barrier. If you use pure lanolin, you do not need to wash it off before feeding your baby again. Pure lanolin is not toxic to your baby. You may also hand express a few drops of breast milk and gently massage that milk into your nipples and allow the milk to air dry.  In the first few weeks after giving birth, some women experience extremely full breasts (engorgement). Engorgement can make your breasts feel heavy, warm, and tender to the touch. Engorgement peaks within 3-5 days after you give  birth. The following recommendations can help ease engorgement:  Completely empty your breasts while breastfeeding or pumping. You may want to start by applying warm, moist heat (in the shower or with warm water-soaked hand towels) just before feeding or pumping. This increases circulation and helps the milk flow. If your baby does not completely empty your breasts while breastfeeding, pump any extra milk after he or she is finished.  Wear a snug bra (nursing or regular) or tank top for 1-2 days to signal your body to slightly decrease milk production.  Apply ice packs to your breasts, unless this is too uncomfortable for you.  Make sure that your baby is latched on and positioned properly while breastfeeding.  If engorgement persists after 48 hours of following these recommendations, contact your health care provider or a lactation consultant. Overall health care recommendations while breastfeeding  Eat healthy foods. Alternate between meals and snacks, eating 3 of each per day. Because what you eat affects your breast milk, some of the foods may make your baby more irritable than usual. Avoid eating these foods if you are sure that they are negatively affecting your baby.  Drink milk, fruit juice, and water to satisfy your thirst (about 10 glasses a day).    Rest often, relax, and continue to take your prenatal vitamins to prevent fatigue, stress, and anemia.  Continue breast self-awareness checks.  Avoid chewing and smoking tobacco. Chemicals from cigarettes that pass into breast milk and exposure to secondhand smoke may harm your baby.  Avoid alcohol and drug use, including marijuana. Some medicines that may be harmful to your baby can pass through breast milk. It is important to ask your health care provider before taking any medicine, including all over-the-counter and prescription medicine as well as vitamin and herbal supplements. It is possible to become pregnant while breastfeeding.  If birth control is desired, ask your health care provider about options that will be safe for your baby. Contact a health care provider if:  You feel like you want to stop breastfeeding or have become frustrated with breastfeeding.  You have painful breasts or nipples.  Your nipples are cracked or bleeding.  Your breasts are red, tender, or warm.  You have a swollen area on either breast.  You have a fever or chills.  You have nausea or vomiting.  You have drainage other than breast milk from your nipples.  Your breasts do not become full before feedings by the fifth day after you give birth.  You feel sad and depressed.  Your baby is too sleepy to eat well.  Your baby is having trouble sleeping.  Your baby is wetting less than 3 diapers in a 24-hour period.  Your baby has less than 3 stools in a 24-hour period.  Your baby's skin or the white part of his or her eyes becomes yellow.  Your baby is not gaining weight by 5 days of age. Get help right away if:  Your baby is overly tired (lethargic) and does not want to wake up and feed.  Your baby develops an unexplained fever. This information is not intended to replace advice given to you by your health care provider. Make sure you discuss any questions you have with your health care provider. Document Released: 04/23/2005 Document Revised: 10/05/2015 Document Reviewed: 10/15/2012 [employer] Interactive Patient Education  2017 [employer]  

## 2016-09-18 NOTE — Progress Notes (Signed)
   PRENATAL VISIT NOTE  Subjective:  Lisa Landry is a 41 y.o. G3P2002 at 5242w0d being seen today for ongoing prenatal care.  She is currently monitored for the following issues for this high-risk pregnancy and has Supervision of high risk pregnancy, antepartum; Multigravida of advanced maternal age; and GBS bacteriuria on her problem list.  Patient reports no complaints.  Contractions: Not present. Vag. Bleeding: None.  Movement: Present. Denies leaking of fluid.   The following portions of the patient's history were reviewed and updated as appropriate: allergies, current medications, past family history, past medical history, past social history, past surgical history and problem list. Problem list updated.  Objective:   Vitals:   09/18/16 1611  BP: 127/71  Pulse: 94  Weight: 163 lb (73.9 kg)    Fetal Status: Fetal Heart Rate (bpm): 155 Fundal Height: 26 cm Movement: Present     General:  Alert, oriented and cooperative. Patient is in no acute distress.  Skin: Skin is warm and dry. No rash noted.   Cardiovascular: Normal heart rate noted  Respiratory: Normal respiratory effort, no problems with respiration noted  Abdomen: Soft, gravid, appropriate for gestational age. Pain/Pressure: Absent     Pelvic:  Cervical exam deferred        Extremities: Normal range of motion.  Edema: None  Mental Status: Normal mood and affect. Normal behavior. Normal judgment and thought content.   Assessment and Plan:  Pregnancy: G3P2002 at 842w0d  1. Supervision of high risk pregnancy, antepartum Continue prenatal care. Anatomy u/s complete--normal growth  2. Elderly multigravida in second trimester Normal NIPS--has f/u growth scan scheduled with MFM  Preterm labor symptoms and general obstetric precautions including but not limited to vaginal bleeding, contractions, leaking of fluid and fetal movement were reviewed in detail with the patient. Please refer to After Visit Summary for other  counseling recommendations.  Return in 2 weeks (on 10/02/2016) for 28 wk labs.   Reva BoresPratt, Samyak Sackmann S, MD

## 2016-10-02 ENCOUNTER — Ambulatory Visit (INDEPENDENT_AMBULATORY_CARE_PROVIDER_SITE_OTHER): Payer: BC Managed Care – PPO | Admitting: Family Medicine

## 2016-10-02 VITALS — BP 100/68 | HR 82 | Wt 164.0 lb

## 2016-10-02 DIAGNOSIS — O0993 Supervision of high risk pregnancy, unspecified, third trimester: Secondary | ICD-10-CM | POA: Diagnosis not present

## 2016-10-02 DIAGNOSIS — Z23 Encounter for immunization: Secondary | ICD-10-CM | POA: Diagnosis not present

## 2016-10-02 DIAGNOSIS — O0990 Supervision of high risk pregnancy, unspecified, unspecified trimester: Secondary | ICD-10-CM

## 2016-10-02 DIAGNOSIS — O09523 Supervision of elderly multigravida, third trimester: Secondary | ICD-10-CM

## 2016-10-02 NOTE — Patient Instructions (Signed)
 Third Trimester of Pregnancy The third trimester is from week 28 through week 40 (months 7 through 9). The third trimester is a time when the unborn baby (fetus) is growing rapidly. At the end of the ninth month, the fetus is about 20 inches in length and weighs 6-10 pounds. Body changes during your third trimester Your body will continue to go through many changes during pregnancy. The changes vary from woman to woman. During the third trimester:  Your weight will continue to increase. You can expect to gain 25-35 pounds (11-16 kg) by the end of the pregnancy.  You may begin to get stretch marks on your hips, abdomen, and breasts.  You may urinate more often because the fetus is moving lower into your pelvis and pressing on your bladder.  You may develop or continue to have heartburn. This is caused by increased hormones that slow down muscles in the digestive tract.  You may develop or continue to have constipation because increased hormones slow digestion and cause the muscles that push waste through your intestines to relax.  You may develop hemorrhoids. These are swollen veins (varicose veins) in the rectum that can itch or be painful.  You may develop swollen, bulging veins (varicose veins) in your legs.  You may have increased body aches in the pelvis, back, or thighs. This is due to weight gain and increased hormones that are relaxing your joints.  You may have changes in your hair. These can include thickening of your hair, rapid growth, and changes in texture. Some women also have hair loss during or after pregnancy, or hair that feels dry or thin. Your hair will most likely return to normal after your baby is born.  Your breasts will continue to grow and they will continue to become tender. A yellow fluid (colostrum) may leak from your breasts. This is the first milk you are producing for your baby.  Your belly button may stick out.  You may notice more swelling in your  hands, face, or ankles.  You may have increased tingling or numbness in your hands, arms, and legs. The skin on your belly may also feel numb.  You may feel short of breath because of your expanding uterus.  You may have more problems sleeping. This can be caused by the size of your belly, increased need to urinate, and an increase in your body's metabolism.  You may notice the fetus "dropping," or moving lower in your abdomen (lightening).  You may have increased vaginal discharge.  You may notice your joints feel loose and you may have pain around your pelvic bone.  What to expect at prenatal visits You will have prenatal exams every 2 weeks until week 36. Then you will have weekly prenatal exams. During a routine prenatal visit:  You will be weighed to make sure you and the baby are growing normally.  Your blood pressure will be taken.  Your abdomen will be measured to track your baby's growth.  The fetal heartbeat will be listened to.  Any test results from the previous visit will be discussed.  You may have a cervical check near your due date to see if your cervix has softened or thinned (effaced).  You will be tested for Group B streptococcus. This happens between 35 and 37 weeks.  Your health care provider may ask you:  What your birth plan is.  How you are feeling.  If you are feeling the baby move.  If you have   had any abnormal symptoms, such as leaking fluid, bleeding, severe headaches, or abdominal cramping.  If you are using any tobacco products, including cigarettes, chewing tobacco, and electronic cigarettes.  If you have any questions.  Other tests or screenings that may be performed during your third trimester include:  Blood tests that check for low iron levels (anemia).  Fetal testing to check the health, activity level, and growth of the fetus. Testing is done if you have certain medical conditions or if there are problems during the  pregnancy.  Nonstress test (NST). This test checks the health of your baby to make sure there are no signs of problems, such as the baby not getting enough oxygen. During this test, a belt is placed around your belly. The baby is made to move, and its heart rate is monitored during movement.  What is false labor? False labor is a condition in which you feel small, irregular tightenings of the muscles in the womb (contractions) that usually go away with rest, changing position, or drinking water. These are called Braxton Hicks contractions. Contractions may last for hours, days, or even weeks before true labor sets in. If contractions come at regular intervals, become more frequent, increase in intensity, or become painful, you should see your health care provider. What are the signs of labor?  Abdominal cramps.  Regular contractions that start at 10 minutes apart and become stronger and more frequent with time.  Contractions that start on the top of the uterus and spread down to the lower abdomen and back.  Increased pelvic pressure and dull back pain.  A watery or bloody mucus discharge that comes from the vagina.  Leaking of amniotic fluid. This is also known as your "water breaking." It could be a slow trickle or a gush. Let your health care provider know if it has a color or strange odor. If you have any of these signs, call your health care provider right away, even if it is before your due date. Follow these instructions at home: Medicines  Follow your health care provider's instructions regarding medicine use. Specific medicines may be either safe or unsafe to take during pregnancy.  Take a prenatal vitamin that contains at least 600 micrograms (mcg) of folic acid.  If you develop constipation, try taking a stool softener if your health care provider approves. Eating and drinking  Eat a balanced diet that includes fresh fruits and vegetables, whole grains, good sources of protein  such as meat, eggs, or tofu, and low-fat dairy. Your health care provider will help you determine the amount of weight gain that is right for you.  Avoid raw meat and uncooked cheese. These carry germs that can cause birth defects in the baby.  If you have low calcium intake from food, talk to your health care provider about whether you should take a daily calcium supplement.  Eat four or five small meals rather than three large meals a day.  Limit foods that are high in fat and processed sugars, such as fried and sweet foods.  To prevent constipation: ? Drink enough fluid to keep your urine clear or pale yellow. ? Eat foods that are high in fiber, such as fresh fruits and vegetables, whole grains, and beans. Activity  Exercise only as directed by your health care provider. Most women can continue their usual exercise routine during pregnancy. Try to exercise for 30 minutes at least 5 days a week. Stop exercising if you experience uterine contractions.  Avoid   heavy lifting.  Do not exercise in extreme heat or humidity, or at high altitudes.  Wear low-heel, comfortable shoes.  Practice good posture.  You may continue to have sex unless your health care provider tells you otherwise. Relieving pain and discomfort  Take frequent breaks and rest with your legs elevated if you have leg cramps or low back pain.  Take warm sitz baths to soothe any pain or discomfort caused by hemorrhoids. Use hemorrhoid cream if your health care provider approves.  Wear a good support bra to prevent discomfort from breast tenderness.  If you develop varicose veins: ? Wear support pantyhose or compression stockings as told by your healthcare provider. ? Elevate your feet for 15 minutes, 3-4 times a day. Prenatal care  Write down your questions. Take them to your prenatal visits.  Keep all your prenatal visits as told by your health care provider. This is important. Safety  Wear your seat belt at  all times when driving.  Make a list of emergency phone numbers, including numbers for family, friends, the hospital, and police and fire departments. General instructions  Avoid cat litter boxes and soil used by cats. These carry germs that can cause birth defects in the baby. If you have a cat, ask someone to clean the litter box for you.  Do not travel far distances unless it is absolutely necessary and only with the approval of your health care provider.  Do not use hot tubs, steam rooms, or saunas.  Do not drink alcohol.  Do not use any products that contain nicotine or tobacco, such as cigarettes and e-cigarettes. If you need help quitting, ask your health care provider.  Do not use any medicinal herbs or unprescribed drugs. These chemicals affect the formation and growth of the baby.  Do not douche or use tampons or scented sanitary pads.  Do not cross your legs for long periods of time.  To prepare for the arrival of your baby: ? Take prenatal classes to understand, practice, and ask questions about labor and delivery. ? Make a trial run to the hospital. ? Visit the hospital and tour the maternity area. ? Arrange for maternity or paternity leave through employers. ? Arrange for family and friends to take care of pets while you are in the hospital. ? Purchase a rear-facing car seat and make sure you know how to install it in your car. ? Pack your hospital bag. ? Prepare the baby's nursery. Make sure to remove all pillows and stuffed animals from the baby's crib to prevent suffocation.  Visit your dentist if you have not gone during your pregnancy. Use a soft toothbrush to brush your teeth and be gentle when you floss. Contact a health care provider if:  You are unsure if you are in labor or if your water has broken.  You become dizzy.  You have mild pelvic cramps, pelvic pressure, or nagging pain in your abdominal area.  You have lower back pain.  You have persistent  nausea, vomiting, or diarrhea.  You have an unusual or bad smelling vaginal discharge.  You have pain when you urinate. Get help right away if:  Your water breaks before 37 weeks.  You have regular contractions less than 5 minutes apart before 37 weeks.  You have a fever.  You are leaking fluid from your vagina.  You have spotting or bleeding from your vagina.  You have severe abdominal pain or cramping.  You have rapid weight loss or weight   gain.  You have shortness of breath with chest pain.  You notice sudden or extreme swelling of your face, hands, ankles, feet, or legs.  Your baby makes fewer than 10 movements in 2 hours.  You have severe headaches that do not go away when you take medicine.  You have vision changes. Summary  The third trimester is from week 28 through week 40, months 7 through 9. The third trimester is a time when the unborn baby (fetus) is growing rapidly.  During the third trimester, your discomfort may increase as you and your baby continue to gain weight. You may have abdominal, leg, and back pain, sleeping problems, and an increased need to urinate.  During the third trimester your breasts will keep growing and they will continue to become tender. A yellow fluid (colostrum) may leak from your breasts. This is the first milk you are producing for your baby.  False labor is a condition in which you feel small, irregular tightenings of the muscles in the womb (contractions) that eventually go away. These are called Braxton Hicks contractions. Contractions may last for hours, days, or even weeks before true labor sets in.  Signs of labor can include: abdominal cramps; regular contractions that start at 10 minutes apart and become stronger and more frequent with time; watery or bloody mucus discharge that comes from the vagina; increased pelvic pressure and dull back pain; and leaking of amniotic fluid. This information is not intended to replace advice  given to you by your health care provider. Make sure you discuss any questions you have with your health care provider. Document Released: 04/17/2001 Document Revised: 09/29/2015 Document Reviewed: 06/24/2012 [employer] Interactive Patient Education  2017 [employer]   Breastfeeding Deciding to breastfeed is one of the best choices you can make for you and your baby. A change in hormones during pregnancy causes your breast tissue to grow and increases the number and size of your milk ducts. These hormones also allow proteins, sugars, and fats from your blood supply to make breast milk in your milk-producing glands. Hormones prevent breast milk from being released before your baby is born as well as prompt milk flow after birth. Once breastfeeding has begun, thoughts of your baby, as well as his or her sucking or crying, can stimulate the release of milk from your milk-producing glands. Benefits of breastfeeding For Your Baby  Your first milk (colostrum) helps your baby's digestive system function better.  There are antibodies in your milk that help your baby fight off infections.  Your baby has a lower incidence of asthma, allergies, and sudden infant death syndrome.  The nutrients in breast milk are better for your baby than infant formulas and are designed uniquely for your baby's needs.  Breast milk improves your baby's brain development.  Your baby is less likely to develop other conditions, such as childhood obesity, asthma, or type 2 diabetes mellitus.  For You  Breastfeeding helps to create a very special bond between you and your baby.  Breastfeeding is convenient. Breast milk is always available at the correct temperature and costs nothing.  Breastfeeding helps to burn calories and helps you lose the weight gained during pregnancy.  Breastfeeding makes your uterus contract to its prepregnancy size faster and slows bleeding (lochia) after you give birth.  Breastfeeding helps  to lower your risk of developing type 2 diabetes mellitus, osteoporosis, and breast or ovarian cancer later in life.  Signs that your baby is hungry Early Signs of Hunger    Increased alertness or activity.  Stretching.  Movement of the head from side to side.  Movement of the head and opening of the mouth when the corner of the mouth or cheek is stroked (rooting).  Increased sucking sounds, smacking lips, cooing, sighing, or squeaking.  Hand-to-mouth movements.  Increased sucking of fingers or hands.  Late Signs of Hunger  Fussing.  Intermittent crying.  Extreme Signs of Hunger Signs of extreme hunger will require calming and consoling before your baby will be able to breastfeed successfully. Do not wait for the following signs of extreme hunger to occur before you initiate breastfeeding:  Restlessness.  A loud, strong cry.  Screaming.  Breastfeeding basics Breastfeeding Initiation  Find a comfortable place to sit or lie down, with your neck and back well supported.  Place a pillow or rolled up blanket under your baby to bring him or her to the level of your breast (if you are seated). Nursing pillows are specially designed to help support your arms and your baby while you breastfeed.  Make sure that your baby's abdomen is facing your abdomen.  Gently massage your breast. With your fingertips, massage from your chest wall toward your nipple in a circular motion. This encourages milk flow. You may need to continue this action during the feeding if your milk flows slowly.  Support your breast with 4 fingers underneath and your thumb above your nipple. Make sure your fingers are well away from your nipple and your baby's mouth.  Stroke your baby's lips gently with your finger or nipple.  When your baby's mouth is open wide enough, quickly bring your baby to your breast, placing your entire nipple and as much of the colored area around your nipple (areola) as possible into  your baby's mouth. ? More areola should be visible above your baby's upper lip than below the lower lip. ? Your baby's tongue should be between his or her lower gum and your breast.  Ensure that your baby's mouth is correctly positioned around your nipple (latched). Your baby's lips should create a seal on your breast and be turned out (everted).  It is common for your baby to suck about 2-3 minutes in order to start the flow of breast milk.  Latching Teaching your baby how to latch on to your breast properly is very important. An improper latch can cause nipple pain and decreased milk supply for you and poor weight gain in your baby. Also, if your baby is not latched onto your nipple properly, he or she may swallow some air during feeding. This can make your baby fussy. Burping your baby when you switch breasts during the feeding can help to get rid of the air. However, teaching your baby to latch on properly is still the best way to prevent fussiness from swallowing air while breastfeeding. Signs that your baby has successfully latched on to your nipple:  Silent tugging or silent sucking, without causing you pain.  Swallowing heard between every 3-4 sucks.  Muscle movement above and in front of his or her ears while sucking.  Signs that your baby has not successfully latched on to nipple:  Sucking sounds or smacking sounds from your baby while breastfeeding.  Nipple pain.  If you think your baby has not latched on correctly, slip your finger into the corner of your baby's mouth to break the suction and place it between your baby's gums. Attempt breastfeeding initiation again. Signs of Successful Breastfeeding Signs from your baby:  A   gradual decrease in the number of sucks or complete cessation of sucking.  Falling asleep.  Relaxation of his or her body.  Retention of a small amount of milk in his or her mouth.  Letting go of your breast by himself or herself.  Signs from  you:  Breasts that have increased in firmness, weight, and size 1-3 hours after feeding.  Breasts that are softer immediately after breastfeeding.  Increased milk volume, as well as a change in milk consistency and color by the fifth day of breastfeeding.  Nipples that are not sore, cracked, or bleeding.  Signs That Your Baby is Getting Enough Milk  Wetting at least 1-2 diapers during the first 24 hours after birth.  Wetting at least 5-6 diapers every 24 hours for the first week after birth. The urine should be clear or pale yellow by 5 days after birth.  Wetting 6-8 diapers every 24 hours as your baby continues to grow and develop.  At least 3 stools in a 24-hour period by age 5 days. The stool should be soft and yellow.  At least 3 stools in a 24-hour period by age 7 days. The stool should be seedy and yellow.  No loss of weight greater than 10% of birth weight during the first 3 days of age.  Average weight gain of 4-7 ounces (113-198 g) per week after age 4 days.  Consistent daily weight gain by age 5 days, without weight loss after the age of 2 weeks.  After a feeding, your baby may spit up a small amount. This is common. Breastfeeding frequency and duration Frequent feeding will help you make more milk and can prevent sore nipples and breast engorgement. Breastfeed when you feel the need to reduce the fullness of your breasts or when your baby shows signs of hunger. This is called "breastfeeding on demand." Avoid introducing a pacifier to your baby while you are working to establish breastfeeding (the first 4-6 weeks after your baby is born). After this time you may choose to use a pacifier. Research has shown that pacifier use during the first year of a baby's life decreases the risk of sudden infant death syndrome (SIDS). Allow your baby to feed on each breast as long as he or she wants. Breastfeed until your baby is finished feeding. When your baby unlatches or falls asleep  while feeding from the first breast, offer the second breast. Because newborns are often sleepy in the first few weeks of life, you may need to awaken your baby to get him or her to feed. Breastfeeding times will vary from baby to baby. However, the following rules can serve as a guide to help you ensure that your baby is properly fed:  Newborns (babies 4 weeks of age or younger) may breastfeed every 1-3 hours.  Newborns should not go longer than 3 hours during the day or 5 hours during the night without breastfeeding.  You should breastfeed your baby a minimum of 8 times in a 24-hour period until you begin to introduce solid foods to your baby at around 6 months of age.  Breast milk pumping Pumping and storing breast milk allows you to ensure that your baby is exclusively fed your breast milk, even at times when you are unable to breastfeed. This is especially important if you are going back to work while you are still breastfeeding or when you are not able to be present during feedings. Your lactation consultant can give you guidelines on how   long it is safe to store breast milk. A breast pump is a machine that allows you to pump milk from your breast into a sterile bottle. The pumped breast milk can then be stored in a refrigerator or freezer. Some breast pumps are operated by hand, while others use electricity. Ask your lactation consultant which type will work best for you. Breast pumps can be purchased, but some hospitals and breastfeeding support groups lease breast pumps on a monthly basis. A lactation consultant can teach you how to hand express breast milk, if you prefer not to use a pump. Caring for your breasts while you breastfeed Nipples can become dry, cracked, and sore while breastfeeding. The following recommendations can help keep your breasts moisturized and healthy:  Avoid using soap on your nipples.  Wear a supportive bra. Although not required, special nursing bras and tank  tops are designed to allow access to your breasts for breastfeeding without taking off your entire bra or top. Avoid wearing underwire-style bras or extremely tight bras.  Air dry your nipples for 3-4minutes after each feeding.  Use only cotton bra pads to absorb leaked breast milk. Leaking of breast milk between feedings is normal.  Use lanolin on your nipples after breastfeeding. Lanolin helps to maintain your skin's normal moisture barrier. If you use pure lanolin, you do not need to wash it off before feeding your baby again. Pure lanolin is not toxic to your baby. You may also hand express a few drops of breast milk and gently massage that milk into your nipples and allow the milk to air dry.  In the first few weeks after giving birth, some women experience extremely full breasts (engorgement). Engorgement can make your breasts feel heavy, warm, and tender to the touch. Engorgement peaks within 3-5 days after you give birth. The following recommendations can help ease engorgement:  Completely empty your breasts while breastfeeding or pumping. You may want to start by applying warm, moist heat (in the shower or with warm water-soaked hand towels) just before feeding or pumping. This increases circulation and helps the milk flow. If your baby does not completely empty your breasts while breastfeeding, pump any extra milk after he or she is finished.  Wear a snug bra (nursing or regular) or tank top for 1-2 days to signal your body to slightly decrease milk production.  Apply ice packs to your breasts, unless this is too uncomfortable for you.  Make sure that your baby is latched on and positioned properly while breastfeeding.  If engorgement persists after 48 hours of following these recommendations, contact your health care provider or a lactation consultant. Overall health care recommendations while breastfeeding  Eat healthy foods. Alternate between meals and snacks, eating 3 of each per  day. Because what you eat affects your breast milk, some of the foods may make your baby more irritable than usual. Avoid eating these foods if you are sure that they are negatively affecting your baby.  Drink milk, fruit juice, and water to satisfy your thirst (about 10 glasses a day).  Rest often, relax, and continue to take your prenatal vitamins to prevent fatigue, stress, and anemia.  Continue breast self-awareness checks.  Avoid chewing and smoking tobacco. Chemicals from cigarettes that pass into breast milk and exposure to secondhand smoke may harm your baby.  Avoid alcohol and drug use, including marijuana. Some medicines that may be harmful to your baby can pass through breast milk. It is important to ask your health care   provider before taking any medicine, including all over-the-counter and prescription medicine as well as vitamin and herbal supplements. It is possible to become pregnant while breastfeeding. If birth control is desired, ask your health care provider about options that will be safe for your baby. Contact a health care provider if:  You feel like you want to stop breastfeeding or have become frustrated with breastfeeding.  You have painful breasts or nipples.  Your nipples are cracked or bleeding.  Your breasts are red, tender, or warm.  You have a swollen area on either breast.  You have a fever or chills.  You have nausea or vomiting.  You have drainage other than breast milk from your nipples.  Your breasts do not become full before feedings by the fifth day after you give birth.  You feel sad and depressed.  Your baby is too sleepy to eat well.  Your baby is having trouble sleeping.  Your baby is wetting less than 3 diapers in a 24-hour period.  Your baby has less than 3 stools in a 24-hour period.  Your baby's skin or the white part of his or her eyes becomes yellow.  Your baby is not gaining weight by 5 days of age. Get help right away  if:  Your baby is overly tired (lethargic) and does not want to wake up and feed.  Your baby develops an unexplained fever. This information is not intended to replace advice given to you by your health care provider. Make sure you discuss any questions you have with your health care provider. Document Released: 04/23/2005 Document Revised: 10/05/2015 Document Reviewed: 10/15/2012 Elsevier Interactive Patient Education  2017 Elsevier Inc.  

## 2016-10-02 NOTE — Progress Notes (Signed)
   PRENATAL VISIT NOTE  Subjective:  Lisa Landry is a 41 y.o. G3P2002 at 4960w0d being seen today for ongoing prenatal care.  She is currently monitored for the following issues for this high-risk pregnancy and has Supervision of high risk pregnancy, antepartum; Multigravida of advanced maternal age; and GBS bacteriuria on her problem list.  Patient reports no complaints.  Contractions: Not present. Vag. Bleeding: None.  Movement: Present. Denies leaking of fluid.   The following portions of the patient's history were reviewed and updated as appropriate: allergies, current medications, past family history, past medical history, past social history, past surgical history and problem list. Problem list updated.  Objective:   Vitals:   10/02/16 0917  BP: 100/68  Pulse: 82  Weight: 164 lb (74.4 kg)    Fetal Status: Fetal Heart Rate (bpm): 152 Fundal Height: 28 cm Movement: Present     General:  Alert, oriented and cooperative. Patient is in no acute distress.  Skin: Skin is warm and dry. No rash noted.   Cardiovascular: Normal heart rate noted  Respiratory: Normal respiratory effort, no problems with respiration noted  Abdomen: Soft, gravid, appropriate for gestational age. Pain/Pressure: Absent     Pelvic:  Cervical exam deferred        Extremities: Normal range of motion.  Edema: Trace  Mental Status: Normal mood and affect. Normal behavior. Normal judgment and thought content.   Assessment and Plan:  Pregnancy: G3P2002 at 3560w0d  1. Supervision of high risk pregnancy, antepartum, third trimester 28 wk labs today with TDaP - CBC - HIV antibody - RPR - Glucose Tolerance, 2 Hours w/1 Hour - Tdap vaccine greater than or equal to 7yo IM  2. Elderly multigravida in third trimester Has f/u u/s for growth scheduled, middle of next month   Preterm labor symptoms and general obstetric precautions including but not limited to vaginal bleeding, contractions, leaking of fluid and  fetal movement were reviewed in detail with the patient. Please refer to After Visit Summary for other counseling recommendations.  Return in 2 weeks (on 10/16/2016).   Reva Boresanya S Mishelle Hassan, MD

## 2016-10-03 LAB — CBC
HEMOGLOBIN: 9.5 g/dL — AB (ref 11.1–15.9)
Hematocrit: 30.1 % — ABNORMAL LOW (ref 34.0–46.6)
MCH: 25.1 pg — ABNORMAL LOW (ref 26.6–33.0)
MCHC: 31.6 g/dL (ref 31.5–35.7)
MCV: 80 fL (ref 79–97)
Platelets: 223 10*3/uL (ref 150–379)
RBC: 3.78 x10E6/uL (ref 3.77–5.28)
RDW: 13.8 % (ref 12.3–15.4)
WBC: 5.4 10*3/uL (ref 3.4–10.8)

## 2016-10-03 LAB — GLUCOSE TOLERANCE, 2 HOURS W/ 1HR
GLUCOSE, 2 HOUR: 98 mg/dL (ref 65–152)
Glucose, 1 hour: 122 mg/dL (ref 65–179)
Glucose, Fasting: 73 mg/dL (ref 65–91)

## 2016-10-03 LAB — HIV ANTIBODY (ROUTINE TESTING W REFLEX): HIV SCREEN 4TH GENERATION: NONREACTIVE

## 2016-10-03 LAB — RPR: RPR: NONREACTIVE

## 2016-10-16 ENCOUNTER — Ambulatory Visit (INDEPENDENT_AMBULATORY_CARE_PROVIDER_SITE_OTHER): Payer: BC Managed Care – PPO | Admitting: Obstetrics and Gynecology

## 2016-10-16 VITALS — BP 114/80 | HR 102 | Wt 164.0 lb

## 2016-10-16 DIAGNOSIS — O0993 Supervision of high risk pregnancy, unspecified, third trimester: Secondary | ICD-10-CM

## 2016-10-16 DIAGNOSIS — R8271 Bacteriuria: Secondary | ICD-10-CM

## 2016-10-16 DIAGNOSIS — O09523 Supervision of elderly multigravida, third trimester: Secondary | ICD-10-CM

## 2016-10-16 DIAGNOSIS — O0990 Supervision of high risk pregnancy, unspecified, unspecified trimester: Secondary | ICD-10-CM

## 2016-10-16 MED ORDER — FERROUS SULFATE 325 (65 FE) MG PO TABS: 325.0000 mg | ORAL_TABLET | Freq: Two times a day (BID) | ORAL | 1 refills | Status: DC

## 2016-10-16 NOTE — Progress Notes (Signed)
   PRENATAL VISIT NOTE  Subjective:  Lisa Landry is a 41 y.o. G3P2002 at 2546w0d being seen today for ongoing prenatal care.  She is currently monitored for the following issues for this high-risk pregnancy and has Supervision of high risk pregnancy, antepartum; Multigravida of advanced maternal age; and GBS bacteriuria on her problem list.  Patient reports no complaints.  Contractions: Not present. Vag. Bleeding: None.  Movement: Present. Denies leaking of fluid.   The following portions of the patient's history were reviewed and updated as appropriate: allergies, current medications, past family history, past medical history, past social history, past surgical history and problem list. Problem list updated.  Objective:   Vitals:   10/16/16 1509  BP: 114/80  Pulse: (!) 102  Weight: 164 lb (74.4 kg)    Fetal Status: Fetal Heart Rate (bpm): 141 Fundal Height: 30 cm Movement: Present     General:  Alert, oriented and cooperative. Patient is in no acute distress.  Skin: Skin is warm and dry. No rash noted.   Cardiovascular: Normal heart rate noted  Respiratory: Normal respiratory effort, no problems with respiration noted  Abdomen: Soft, gravid, appropriate for gestational age. Pain/Pressure: Present     Pelvic:  Cervical exam deferred        Extremities: Normal range of motion.  Edema: None  Mental Status: Normal mood and affect. Normal behavior. Normal judgment and thought content.   Assessment and Plan:  Pregnancy: G3P2002 at 1146w0d  1. Supervision of high risk pregnancy, antepartum Patient is doing well without complaints Reviewed normal third trimester labs with patient- Rx ferrous sulfate provided  2. GBS bacteriuria Will provide prophylaxis in labor  3. Elderly multigravida in third trimester Follow up growth ultrasound on 6/19  Preterm labor symptoms and general obstetric precautions including but not limited to vaginal bleeding, contractions, leaking of fluid and  fetal movement were reviewed in detail with the patient. Please refer to After Visit Summary for other counseling recommendations.  Return in about 2 weeks (around 10/30/2016) for ROB.   Catalina AntiguaPeggy Garlene Apperson, MD

## 2016-10-23 ENCOUNTER — Ambulatory Visit (HOSPITAL_COMMUNITY)
Admission: RE | Admit: 2016-10-23 | Discharge: 2016-10-23 | Disposition: A | Discharge status: Home / Self Care | Payer: BC Managed Care – PPO | Source: Ambulatory Visit | Attending: Obstetrics & Gynecology | Admitting: Obstetrics & Gynecology

## 2016-10-23 ENCOUNTER — Other Ambulatory Visit (HOSPITAL_COMMUNITY): Payer: Self-pay | Admitting: *Deleted

## 2016-10-23 ENCOUNTER — Other Ambulatory Visit (HOSPITAL_COMMUNITY): Payer: Self-pay | Admitting: Maternal and Fetal Medicine

## 2016-10-23 ENCOUNTER — Encounter (HOSPITAL_COMMUNITY): Payer: Self-pay

## 2016-10-23 DIAGNOSIS — O09523 Supervision of elderly multigravida, third trimester: Secondary | ICD-10-CM

## 2016-10-23 DIAGNOSIS — Z3A30 30 weeks gestation of pregnancy: Secondary | ICD-10-CM | POA: Insufficient documentation

## 2016-10-23 DIAGNOSIS — Z3A31 31 weeks gestation of pregnancy: Secondary | ICD-10-CM

## 2016-10-30 ENCOUNTER — Ambulatory Visit (INDEPENDENT_AMBULATORY_CARE_PROVIDER_SITE_OTHER): Payer: BC Managed Care – PPO | Admitting: Family Medicine

## 2016-10-30 VITALS — BP 128/74 | HR 98 | Wt 166.0 lb

## 2016-10-30 DIAGNOSIS — O0993 Supervision of high risk pregnancy, unspecified, third trimester: Secondary | ICD-10-CM

## 2016-10-30 DIAGNOSIS — O09523 Supervision of elderly multigravida, third trimester: Secondary | ICD-10-CM

## 2016-10-30 DIAGNOSIS — O0990 Supervision of high risk pregnancy, unspecified, unspecified trimester: Secondary | ICD-10-CM

## 2016-10-30 NOTE — Progress Notes (Signed)
   PRENATAL VISIT NOTE  Subjective:  Lisa Landry is a 41 y.o. G3P2002 at 2870w0d being seen today for ongoing prenatal care.  She is currently monitored for the following issues for this high-risk pregnancy and has Supervision of high risk pregnancy, antepartum; Multigravida of advanced maternal age; and GBS bacteriuria on her problem list.  Patient reports no complaints.  Contractions: Not present. Vag. Bleeding: None.  Movement: Present. Denies leaking of fluid.   The following portions of the patient's history were reviewed and updated as appropriate: allergies, current medications, past family history, past medical history, past social history, past surgical history and problem list. Problem list updated.  Objective:   Vitals:   10/30/16 1422  BP: 128/74  Pulse: 98  Weight: 166 lb (75.3 kg)    Fetal Status: Fetal Heart Rate (bpm): 158 Fundal Height: 32 cm Movement: Present     General:  Alert, oriented and cooperative. Patient is in no acute distress.  Skin: Skin is warm and dry. No rash noted.   Cardiovascular: Normal heart rate noted  Respiratory: Normal respiratory effort, no problems with respiration noted  Abdomen: Soft, gravid, appropriate for gestational age. Pain/Pressure: Absent     Pelvic:  Cervical exam deferred        Extremities: Normal range of motion.  Edema: None  Mental Status: Normal mood and affect. Normal behavior. Normal judgment and thought content.   Assessment and Plan:  Pregnancy: G3P2002 at 570w0d  1. Elderly multigravida in third trimester Begin 2x/wk testing at 32 wks Normal u/s for growth 6/19 64%--f/u scheduled mid July  2. Supervision of high risk pregnancy, antepartum Continue prenatal care.   Preterm labor symptoms and general obstetric precautions including but not limited to vaginal bleeding, contractions, leaking of fluid and fetal movement were reviewed in detail with the patient. Please refer to After Visit Summary for other  counseling recommendations.  Return in 2 weeks (on 11/13/2016).   Reva Boresanya S Aariah Godette, MD

## 2016-10-30 NOTE — Patient Instructions (Signed)
 Third Trimester of Pregnancy The third trimester is from week 28 through week 40 (months 7 through 9). The third trimester is a time when the unborn baby (fetus) is growing rapidly. At the end of the ninth month, the fetus is about 20 inches in length and weighs 6-10 pounds. Body changes during your third trimester Your body will continue to go through many changes during pregnancy. The changes vary from woman to woman. During the third trimester:  Your weight will continue to increase. You can expect to gain 25-35 pounds (11-16 kg) by the end of the pregnancy.  You may begin to get stretch marks on your hips, abdomen, and breasts.  You may urinate more often because the fetus is moving lower into your pelvis and pressing on your bladder.  You may develop or continue to have heartburn. This is caused by increased hormones that slow down muscles in the digestive tract.  You may develop or continue to have constipation because increased hormones slow digestion and cause the muscles that push waste through your intestines to relax.  You may develop hemorrhoids. These are swollen veins (varicose veins) in the rectum that can itch or be painful.  You may develop swollen, bulging veins (varicose veins) in your legs.  You may have increased body aches in the pelvis, back, or thighs. This is due to weight gain and increased hormones that are relaxing your joints.  You may have changes in your hair. These can include thickening of your hair, rapid growth, and changes in texture. Some women also have hair loss during or after pregnancy, or hair that feels dry or thin. Your hair will most likely return to normal after your baby is born.  Your breasts will continue to grow and they will continue to become tender. A yellow fluid (colostrum) may leak from your breasts. This is the first milk you are producing for your baby.  Your belly button may stick out.  You may notice more swelling in your  hands, face, or ankles.  You may have increased tingling or numbness in your hands, arms, and legs. The skin on your belly may also feel numb.  You may feel short of breath because of your expanding uterus.  You may have more problems sleeping. This can be caused by the size of your belly, increased need to urinate, and an increase in your body's metabolism.  You may notice the fetus "dropping," or moving lower in your abdomen (lightening).  You may have increased vaginal discharge.  You may notice your joints feel loose and you may have pain around your pelvic bone.  What to expect at prenatal visits You will have prenatal exams every 2 weeks until week 36. Then you will have weekly prenatal exams. During a routine prenatal visit:  You will be weighed to make sure you and the baby are growing normally.  Your blood pressure will be taken.  Your abdomen will be measured to track your baby's growth.  The fetal heartbeat will be listened to.  Any test results from the previous visit will be discussed.  You may have a cervical check near your due date to see if your cervix has softened or thinned (effaced).  You will be tested for Group B streptococcus. This happens between 35 and 37 weeks.  Your health care provider may ask you:  What your birth plan is.  How you are feeling.  If you are feeling the baby move.  If you have   had any abnormal symptoms, such as leaking fluid, bleeding, severe headaches, or abdominal cramping.  If you are using any tobacco products, including cigarettes, chewing tobacco, and electronic cigarettes.  If you have any questions.  Other tests or screenings that may be performed during your third trimester include:  Blood tests that check for low iron levels (anemia).  Fetal testing to check the health, activity level, and growth of the fetus. Testing is done if you have certain medical conditions or if there are problems during the  pregnancy.  Nonstress test (NST). This test checks the health of your baby to make sure there are no signs of problems, such as the baby not getting enough oxygen. During this test, a belt is placed around your belly. The baby is made to move, and its heart rate is monitored during movement.  What is false labor? False labor is a condition in which you feel small, irregular tightenings of the muscles in the womb (contractions) that usually go away with rest, changing position, or drinking water. These are called Braxton Hicks contractions. Contractions may last for hours, days, or even weeks before true labor sets in. If contractions come at regular intervals, become more frequent, increase in intensity, or become painful, you should see your health care provider. What are the signs of labor?  Abdominal cramps.  Regular contractions that start at 10 minutes apart and become stronger and more frequent with time.  Contractions that start on the top of the uterus and spread down to the lower abdomen and back.  Increased pelvic pressure and dull back pain.  A watery or bloody mucus discharge that comes from the vagina.  Leaking of amniotic fluid. This is also known as your "water breaking." It could be a slow trickle or a gush. Let your health care provider know if it has a color or strange odor. If you have any of these signs, call your health care provider right away, even if it is before your due date. Follow these instructions at home: Medicines  Follow your health care provider's instructions regarding medicine use. Specific medicines may be either safe or unsafe to take during pregnancy.  Take a prenatal vitamin that contains at least 600 micrograms (mcg) of folic acid.  If you develop constipation, try taking a stool softener if your health care provider approves. Eating and drinking  Eat a balanced diet that includes fresh fruits and vegetables, whole grains, good sources of protein  such as meat, eggs, or tofu, and low-fat dairy. Your health care provider will help you determine the amount of weight gain that is right for you.  Avoid raw meat and uncooked cheese. These carry germs that can cause birth defects in the baby.  If you have low calcium intake from food, talk to your health care provider about whether you should take a daily calcium supplement.  Eat four or five small meals rather than three large meals a day.  Limit foods that are high in fat and processed sugars, such as fried and sweet foods.  To prevent constipation: ? Drink enough fluid to keep your urine clear or pale yellow. ? Eat foods that are high in fiber, such as fresh fruits and vegetables, whole grains, and beans. Activity  Exercise only as directed by your health care provider. Most women can continue their usual exercise routine during pregnancy. Try to exercise for 30 minutes at least 5 days a week. Stop exercising if you experience uterine contractions.  Avoid   heavy lifting.  Do not exercise in extreme heat or humidity, or at high altitudes.  Wear low-heel, comfortable shoes.  Practice good posture.  You may continue to have sex unless your health care provider tells you otherwise. Relieving pain and discomfort  Take frequent breaks and rest with your legs elevated if you have leg cramps or low back pain.  Take warm sitz baths to soothe any pain or discomfort caused by hemorrhoids. Use hemorrhoid cream if your health care provider approves.  Wear a good support bra to prevent discomfort from breast tenderness.  If you develop varicose veins: ? Wear support pantyhose or compression stockings as told by your healthcare provider. ? Elevate your feet for 15 minutes, 3-4 times a day. Prenatal care  Write down your questions. Take them to your prenatal visits.  Keep all your prenatal visits as told by your health care provider. This is important. Safety  Wear your seat belt at  all times when driving.  Make a list of emergency phone numbers, including numbers for family, friends, the hospital, and police and fire departments. General instructions  Avoid cat litter boxes and soil used by cats. These carry germs that can cause birth defects in the baby. If you have a cat, ask someone to clean the litter box for you.  Do not travel far distances unless it is absolutely necessary and only with the approval of your health care provider.  Do not use hot tubs, steam rooms, or saunas.  Do not drink alcohol.  Do not use any products that contain nicotine or tobacco, such as cigarettes and e-cigarettes. If you need help quitting, ask your health care provider.  Do not use any medicinal herbs or unprescribed drugs. These chemicals affect the formation and growth of the baby.  Do not douche or use tampons or scented sanitary pads.  Do not cross your legs for long periods of time.  To prepare for the arrival of your baby: ? Take prenatal classes to understand, practice, and ask questions about labor and delivery. ? Make a trial run to the hospital. ? Visit the hospital and tour the maternity area. ? Arrange for maternity or paternity leave through employers. ? Arrange for family and friends to take care of pets while you are in the hospital. ? Purchase a rear-facing car seat and make sure you know how to install it in your car. ? Pack your hospital bag. ? Prepare the baby's nursery. Make sure to remove all pillows and stuffed animals from the baby's crib to prevent suffocation.  Visit your dentist if you have not gone during your pregnancy. Use a soft toothbrush to brush your teeth and be gentle when you floss. Contact a health care provider if:  You are unsure if you are in labor or if your water has broken.  You become dizzy.  You have mild pelvic cramps, pelvic pressure, or nagging pain in your abdominal area.  You have lower back pain.  You have persistent  nausea, vomiting, or diarrhea.  You have an unusual or bad smelling vaginal discharge.  You have pain when you urinate. Get help right away if:  Your water breaks before 37 weeks.  You have regular contractions less than 5 minutes apart before 37 weeks.  You have a fever.  You are leaking fluid from your vagina.  You have spotting or bleeding from your vagina.  You have severe abdominal pain or cramping.  You have rapid weight loss or weight   gain.  You have shortness of breath with chest pain.  You notice sudden or extreme swelling of your face, hands, ankles, feet, or legs.  Your baby makes fewer than 10 movements in 2 hours.  You have severe headaches that do not go away when you take medicine.  You have vision changes. Summary  The third trimester is from week 28 through week 40, months 7 through 9. The third trimester is a time when the unborn baby (fetus) is growing rapidly.  During the third trimester, your discomfort may increase as you and your baby continue to gain weight. You may have abdominal, leg, and back pain, sleeping problems, and an increased need to urinate.  During the third trimester your breasts will keep growing and they will continue to become tender. A yellow fluid (colostrum) may leak from your breasts. This is the first milk you are producing for your baby.  False labor is a condition in which you feel small, irregular tightenings of the muscles in the womb (contractions) that eventually go away. These are called Braxton Hicks contractions. Contractions may last for hours, days, or even weeks before true labor sets in.  Signs of labor can include: abdominal cramps; regular contractions that start at 10 minutes apart and become stronger and more frequent with time; watery or bloody mucus discharge that comes from the vagina; increased pelvic pressure and dull back pain; and leaking of amniotic fluid. This information is not intended to replace advice  given to you by your health care provider. Make sure you discuss any questions you have with your health care provider. Document Released: 04/17/2001 Document Revised: 09/29/2015 Document Reviewed: 06/24/2012 [employer] Interactive Patient Education  2017 [employer]   Breastfeeding Deciding to breastfeed is one of the best choices you can make for you and your baby. A change in hormones during pregnancy causes your breast tissue to grow and increases the number and size of your milk ducts. These hormones also allow proteins, sugars, and fats from your blood supply to make breast milk in your milk-producing glands. Hormones prevent breast milk from being released before your baby is born as well as prompt milk flow after birth. Once breastfeeding has begun, thoughts of your baby, as well as his or her sucking or crying, can stimulate the release of milk from your milk-producing glands. Benefits of breastfeeding For Your Baby  Your first milk (colostrum) helps your baby's digestive system function better.  There are antibodies in your milk that help your baby fight off infections.  Your baby has a lower incidence of asthma, allergies, and sudden infant death syndrome.  The nutrients in breast milk are better for your baby than infant formulas and are designed uniquely for your baby's needs.  Breast milk improves your baby's brain development.  Your baby is less likely to develop other conditions, such as childhood obesity, asthma, or type 2 diabetes mellitus.  For You  Breastfeeding helps to create a very special bond between you and your baby.  Breastfeeding is convenient. Breast milk is always available at the correct temperature and costs nothing.  Breastfeeding helps to burn calories and helps you lose the weight gained during pregnancy.  Breastfeeding makes your uterus contract to its prepregnancy size faster and slows bleeding (lochia) after you give birth.  Breastfeeding helps  to lower your risk of developing type 2 diabetes mellitus, osteoporosis, and breast or ovarian cancer later in life.  Signs that your baby is hungry Early Signs of Hunger    Increased alertness or activity.  Stretching.  Movement of the head from side to side.  Movement of the head and opening of the mouth when the corner of the mouth or cheek is stroked (rooting).  Increased sucking sounds, smacking lips, cooing, sighing, or squeaking.  Hand-to-mouth movements.  Increased sucking of fingers or hands.  Late Signs of Hunger  Fussing.  Intermittent crying.  Extreme Signs of Hunger Signs of extreme hunger will require calming and consoling before your baby will be able to breastfeed successfully. Do not wait for the following signs of extreme hunger to occur before you initiate breastfeeding:  Restlessness.  A loud, strong cry.  Screaming.  Breastfeeding basics Breastfeeding Initiation  Find a comfortable place to sit or lie down, with your neck and back well supported.  Place a pillow or rolled up blanket under your baby to bring him or her to the level of your breast (if you are seated). Nursing pillows are specially designed to help support your arms and your baby while you breastfeed.  Make sure that your baby's abdomen is facing your abdomen.  Gently massage your breast. With your fingertips, massage from your chest wall toward your nipple in a circular motion. This encourages milk flow. You may need to continue this action during the feeding if your milk flows slowly.  Support your breast with 4 fingers underneath and your thumb above your nipple. Make sure your fingers are well away from your nipple and your baby's mouth.  Stroke your baby's lips gently with your finger or nipple.  When your baby's mouth is open wide enough, quickly bring your baby to your breast, placing your entire nipple and as much of the colored area around your nipple (areola) as possible into  your baby's mouth. ? More areola should be visible above your baby's upper lip than below the lower lip. ? Your baby's tongue should be between his or her lower gum and your breast.  Ensure that your baby's mouth is correctly positioned around your nipple (latched). Your baby's lips should create a seal on your breast and be turned out (everted).  It is common for your baby to suck about 2-3 minutes in order to start the flow of breast milk.  Latching Teaching your baby how to latch on to your breast properly is very important. An improper latch can cause nipple pain and decreased milk supply for you and poor weight gain in your baby. Also, if your baby is not latched onto your nipple properly, he or she may swallow some air during feeding. This can make your baby fussy. Burping your baby when you switch breasts during the feeding can help to get rid of the air. However, teaching your baby to latch on properly is still the best way to prevent fussiness from swallowing air while breastfeeding. Signs that your baby has successfully latched on to your nipple:  Silent tugging or silent sucking, without causing you pain.  Swallowing heard between every 3-4 sucks.  Muscle movement above and in front of his or her ears while sucking.  Signs that your baby has not successfully latched on to nipple:  Sucking sounds or smacking sounds from your baby while breastfeeding.  Nipple pain.  If you think your baby has not latched on correctly, slip your finger into the corner of your baby's mouth to break the suction and place it between your baby's gums. Attempt breastfeeding initiation again. Signs of Successful Breastfeeding Signs from your baby:  A   gradual decrease in the number of sucks or complete cessation of sucking.  Falling asleep.  Relaxation of his or her body.  Retention of a small amount of milk in his or her mouth.  Letting go of your breast by himself or herself.  Signs from  you:  Breasts that have increased in firmness, weight, and size 1-3 hours after feeding.  Breasts that are softer immediately after breastfeeding.  Increased milk volume, as well as a change in milk consistency and color by the fifth day of breastfeeding.  Nipples that are not sore, cracked, or bleeding.  Signs That Your Baby is Getting Enough Milk  Wetting at least 1-2 diapers during the first 24 hours after birth.  Wetting at least 5-6 diapers every 24 hours for the first week after birth. The urine should be clear or pale yellow by 5 days after birth.  Wetting 6-8 diapers every 24 hours as your baby continues to grow and develop.  At least 3 stools in a 24-hour period by age 5 days. The stool should be soft and yellow.  At least 3 stools in a 24-hour period by age 7 days. The stool should be seedy and yellow.  No loss of weight greater than 10% of birth weight during the first 3 days of age.  Average weight gain of 4-7 ounces (113-198 g) per week after age 4 days.  Consistent daily weight gain by age 5 days, without weight loss after the age of 2 weeks.  After a feeding, your baby may spit up a small amount. This is common. Breastfeeding frequency and duration Frequent feeding will help you make more milk and can prevent sore nipples and breast engorgement. Breastfeed when you feel the need to reduce the fullness of your breasts or when your baby shows signs of hunger. This is called "breastfeeding on demand." Avoid introducing a pacifier to your baby while you are working to establish breastfeeding (the first 4-6 weeks after your baby is born). After this time you may choose to use a pacifier. Research has shown that pacifier use during the first year of a baby's life decreases the risk of sudden infant death syndrome (SIDS). Allow your baby to feed on each breast as long as he or she wants. Breastfeed until your baby is finished feeding. When your baby unlatches or falls asleep  while feeding from the first breast, offer the second breast. Because newborns are often sleepy in the first few weeks of life, you may need to awaken your baby to get him or her to feed. Breastfeeding times will vary from baby to baby. However, the following rules can serve as a guide to help you ensure that your baby is properly fed:  Newborns (babies 4 weeks of age or younger) may breastfeed every 1-3 hours.  Newborns should not go longer than 3 hours during the day or 5 hours during the night without breastfeeding.  You should breastfeed your baby a minimum of 8 times in a 24-hour period until you begin to introduce solid foods to your baby at around 6 months of age.  Breast milk pumping Pumping and storing breast milk allows you to ensure that your baby is exclusively fed your breast milk, even at times when you are unable to breastfeed. This is especially important if you are going back to work while you are still breastfeeding or when you are not able to be present during feedings. Your lactation consultant can give you guidelines on how   long it is safe to store breast milk. A breast pump is a machine that allows you to pump milk from your breast into a sterile bottle. The pumped breast milk can then be stored in a refrigerator or freezer. Some breast pumps are operated by hand, while others use electricity. Ask your lactation consultant which type will work best for you. Breast pumps can be purchased, but some hospitals and breastfeeding support groups lease breast pumps on a monthly basis. A lactation consultant can teach you how to hand express breast milk, if you prefer not to use a pump. Caring for your breasts while you breastfeed Nipples can become dry, cracked, and sore while breastfeeding. The following recommendations can help keep your breasts moisturized and healthy:  Avoid using soap on your nipples.  Wear a supportive bra. Although not required, special nursing bras and tank  tops are designed to allow access to your breasts for breastfeeding without taking off your entire bra or top. Avoid wearing underwire-style bras or extremely tight bras.  Air dry your nipples for 3-4minutes after each feeding.  Use only cotton bra pads to absorb leaked breast milk. Leaking of breast milk between feedings is normal.  Use lanolin on your nipples after breastfeeding. Lanolin helps to maintain your skin's normal moisture barrier. If you use pure lanolin, you do not need to wash it off before feeding your baby again. Pure lanolin is not toxic to your baby. You may also hand express a few drops of breast milk and gently massage that milk into your nipples and allow the milk to air dry.  In the first few weeks after giving birth, some women experience extremely full breasts (engorgement). Engorgement can make your breasts feel heavy, warm, and tender to the touch. Engorgement peaks within 3-5 days after you give birth. The following recommendations can help ease engorgement:  Completely empty your breasts while breastfeeding or pumping. You may want to start by applying warm, moist heat (in the shower or with warm water-soaked hand towels) just before feeding or pumping. This increases circulation and helps the milk flow. If your baby does not completely empty your breasts while breastfeeding, pump any extra milk after he or she is finished.  Wear a snug bra (nursing or regular) or tank top for 1-2 days to signal your body to slightly decrease milk production.  Apply ice packs to your breasts, unless this is too uncomfortable for you.  Make sure that your baby is latched on and positioned properly while breastfeeding.  If engorgement persists after 48 hours of following these recommendations, contact your health care provider or a lactation consultant. Overall health care recommendations while breastfeeding  Eat healthy foods. Alternate between meals and snacks, eating 3 of each per  day. Because what you eat affects your breast milk, some of the foods may make your baby more irritable than usual. Avoid eating these foods if you are sure that they are negatively affecting your baby.  Drink milk, fruit juice, and water to satisfy your thirst (about 10 glasses a day).  Rest often, relax, and continue to take your prenatal vitamins to prevent fatigue, stress, and anemia.  Continue breast self-awareness checks.  Avoid chewing and smoking tobacco. Chemicals from cigarettes that pass into breast milk and exposure to secondhand smoke may harm your baby.  Avoid alcohol and drug use, including marijuana. Some medicines that may be harmful to your baby can pass through breast milk. It is important to ask your health care   provider before taking any medicine, including all over-the-counter and prescription medicine as well as vitamin and herbal supplements. It is possible to become pregnant while breastfeeding. If birth control is desired, ask your health care provider about options that will be safe for your baby. Contact a health care provider if:  You feel like you want to stop breastfeeding or have become frustrated with breastfeeding.  You have painful breasts or nipples.  Your nipples are cracked or bleeding.  Your breasts are red, tender, or warm.  You have a swollen area on either breast.  You have a fever or chills.  You have nausea or vomiting.  You have drainage other than breast milk from your nipples.  Your breasts do not become full before feedings by the fifth day after you give birth.  You feel sad and depressed.  Your baby is too sleepy to eat well.  Your baby is having trouble sleeping.  Your baby is wetting less than 3 diapers in a 24-hour period.  Your baby has less than 3 stools in a 24-hour period.  Your baby's skin or the white part of his or her eyes becomes yellow.  Your baby is not gaining weight by 5 days of age. Get help right away  if:  Your baby is overly tired (lethargic) and does not want to wake up and feed.  Your baby develops an unexplained fever. This information is not intended to replace advice given to you by your health care provider. Make sure you discuss any questions you have with your health care provider. Document Released: 04/23/2005 Document Revised: 10/05/2015 Document Reviewed: 10/15/2012 Elsevier Interactive Patient Education  2017 Elsevier Inc.  

## 2016-11-13 ENCOUNTER — Ambulatory Visit (INDEPENDENT_AMBULATORY_CARE_PROVIDER_SITE_OTHER): Payer: BC Managed Care – PPO | Admitting: Obstetrics and Gynecology

## 2016-11-13 VITALS — BP 104/67 | HR 85 | Wt 165.0 lb

## 2016-11-13 DIAGNOSIS — O26899 Other specified pregnancy related conditions, unspecified trimester: Secondary | ICD-10-CM

## 2016-11-13 DIAGNOSIS — G5600 Carpal tunnel syndrome, unspecified upper limb: Secondary | ICD-10-CM

## 2016-11-13 DIAGNOSIS — O26893 Other specified pregnancy related conditions, third trimester: Secondary | ICD-10-CM

## 2016-11-13 NOTE — Progress Notes (Signed)
Prenatal Visit Note Date: 11/13/2016 Clinic: [facility]  Subjective:  Lisa Landry is a 41 y.o. G3P2002 at 6526w0d being seen today for ongoing prenatal care.  She is currently monitored for the following issues for this high-risk pregnancy and has Supervision of high risk pregnancy, antepartum; Multigravida of advanced maternal age; GBS bacteriuria; and Carpal tunnel syndrome during pregnancy on her problem list.  Patient reports CTS s/s.   Contractions: Not present. Vag. Bleeding: None.  Movement: Present. Denies leaking of fluid.   The following portions of the patient's history were reviewed and updated as appropriate: allergies, current medications, past family history, past medical history, past social history, past surgical history and problem list. Problem list updated.  Objective:   Vitals:   11/13/16 1045  BP: 104/67  Pulse: 85  Weight: 165 lb (74.8 kg)    Fetal Status: Fetal Heart Rate (bpm): 140   Movement: Present  Presentation: Vertex  General:  Alert, oriented and cooperative. Patient is in no acute distress.  Skin: Skin is warm and dry. No rash noted.   Cardiovascular: Normal heart rate noted  Respiratory: Normal respiratory effort, no problems with respiration noted  Abdomen: Soft, gravid, appropriate for gestational age. Pain/Pressure: Absent     Pelvic:  Cervical exam deferred        Extremities: Normal range of motion.  Edema: None  Mental Status: Normal mood and affect. Normal behavior. Normal judgment and thought content.   Urinalysis:      Assessment and Plan:  Pregnancy: G3P2002 at 7026w0d  1. Carpal tunnel syndrome during pregnancy Already doing wrist braces and they help. Start ap testing 2x/week [facility] Surveillance growth scan already scheduled BTL. [facility]  Preterm labor symptoms and general obstetric precautions including but not limited to vaginal bleeding, contractions, leaking of fluid and fetal movement were  reviewed in detail with the patient. Please refer to After Visit Summary for other counseling recommendations.  Return in about 2 weeks (around 11/27/2016) for rob. start 2x/week testing then.   [place] BingPickens, Olar Santini, MD

## 2016-11-27 ENCOUNTER — Ambulatory Visit (INDEPENDENT_AMBULATORY_CARE_PROVIDER_SITE_OTHER): Payer: BC Managed Care – PPO | Admitting: Obstetrics & Gynecology

## 2016-11-27 VITALS — BP 113/71 | HR 96 | Wt 168.4 lb

## 2016-11-27 DIAGNOSIS — Z113 Encounter for screening for infections with a predominantly sexual mode of transmission: Secondary | ICD-10-CM

## 2016-11-27 DIAGNOSIS — O09523 Supervision of elderly multigravida, third trimester: Secondary | ICD-10-CM

## 2016-11-27 DIAGNOSIS — O0990 Supervision of high risk pregnancy, unspecified, unspecified trimester: Secondary | ICD-10-CM

## 2016-11-27 NOTE — Progress Notes (Signed)
   PRENATAL VISIT NOTE  Subjective:  Lisa Landry is a 41 y.o. G3P2002 at 4139w0d being seen today for ongoing prenatal care.  She is currently monitored for the following issues for this low-risk pregnancy and has Supervision of high risk pregnancy, antepartum; Multigravida of advanced maternal age; GBS bacteriuria; and Carpal tunnel syndrome during pregnancy on her problem list.  Patient reports no complaints.  Contractions: Not present. Vag. Bleeding: None.  Movement: Present. Denies leaking of fluid.   The following portions of the patient's history were reviewed and updated as appropriate: allergies, current medications, past family history, past medical history, past social history, past surgical history and problem list. Problem list updated.  Objective:   Vitals:   11/27/16 1150  BP: 113/71  Pulse: 96  Weight: 168 lb 6.4 oz (76.4 kg)    Fetal Status: Fetal Heart Rate (bpm): 152   Movement: Present     General:  Alert, oriented and cooperative. Patient is in no acute distress.  Skin: Skin is warm and dry. No rash noted.   Cardiovascular: Normal heart rate noted  Respiratory: Normal respiratory effort, no problems with respiration noted  Abdomen: Soft, gravid, appropriate for gestational age.  Pain/Pressure: Absent     Pelvic: Cervical exam deferred        Extremities: Normal range of motion.  Edema: None  Mental Status:  Normal mood and affect. Normal behavior. Normal judgment and thought content.   Assessment and Plan:  Pregnancy: G3P2002 at 839w0d  1. Elderly multigravida in third trimester  - Cervicovaginal ancillary only  2. Supervision of high risk pregnancy, antepartum - start weekly BPP - Cervicovaginal ancillary only  Preterm labor symptoms and general obstetric precautions including but not limited to vaginal bleeding, contractions, leaking of fluid and fetal movement were reviewed in detail with the patient. Please refer to After Visit Summary for other  counseling recommendations.  Return in about 1 week (around 12/04/2016).   Allie BossierMyra C Nai Borromeo, MD

## 2016-11-28 LAB — CERVICOVAGINAL ANCILLARY ONLY
CHLAMYDIA, DNA PROBE: NEGATIVE
Neisseria Gonorrhea: NEGATIVE

## 2016-12-03 ENCOUNTER — Other Ambulatory Visit: Payer: BC Managed Care – PPO

## 2016-12-04 ENCOUNTER — Ambulatory Visit (HOSPITAL_COMMUNITY)
Admission: RE | Admit: 2016-12-04 | Discharge: 2016-12-04 | Disposition: A | Discharge status: Home / Self Care | Payer: BLUE CROSS/BLUE SHIELD | Source: Ambulatory Visit | Attending: Obstetrics & Gynecology | Admitting: Obstetrics & Gynecology

## 2016-12-04 ENCOUNTER — Other Ambulatory Visit (HOSPITAL_COMMUNITY): Payer: Self-pay | Admitting: Obstetrics and Gynecology

## 2016-12-04 ENCOUNTER — Other Ambulatory Visit: Payer: Self-pay | Admitting: Obstetrics & Gynecology

## 2016-12-04 DIAGNOSIS — O09523 Supervision of elderly multigravida, third trimester: Secondary | ICD-10-CM

## 2016-12-04 DIAGNOSIS — R8271 Bacteriuria: Secondary | ICD-10-CM

## 2016-12-04 DIAGNOSIS — Z3A36 36 weeks gestation of pregnancy: Secondary | ICD-10-CM | POA: Diagnosis not present

## 2016-12-04 DIAGNOSIS — O0990 Supervision of high risk pregnancy, unspecified, unspecified trimester: Secondary | ICD-10-CM

## 2016-12-04 DIAGNOSIS — O0993 Supervision of high risk pregnancy, unspecified, third trimester: Secondary | ICD-10-CM | POA: Insufficient documentation

## 2016-12-04 DIAGNOSIS — Z362 Encounter for other antenatal screening follow-up: Secondary | ICD-10-CM

## 2016-12-05 ENCOUNTER — Ambulatory Visit (INDEPENDENT_AMBULATORY_CARE_PROVIDER_SITE_OTHER): Payer: BC Managed Care – PPO | Admitting: Obstetrics & Gynecology

## 2016-12-05 ENCOUNTER — Encounter (INDEPENDENT_AMBULATORY_CARE_PROVIDER_SITE_OTHER): Payer: Self-pay | Admitting: *Deleted

## 2016-12-05 VITALS — BP 112/73 | HR 83 | Wt 171.0 lb

## 2016-12-05 DIAGNOSIS — O09523 Supervision of elderly multigravida, third trimester: Secondary | ICD-10-CM

## 2016-12-05 DIAGNOSIS — O0990 Supervision of high risk pregnancy, unspecified, unspecified trimester: Secondary | ICD-10-CM

## 2016-12-05 DIAGNOSIS — O0993 Supervision of high risk pregnancy, unspecified, third trimester: Secondary | ICD-10-CM

## 2016-12-05 DIAGNOSIS — R8271 Bacteriuria: Secondary | ICD-10-CM

## 2016-12-05 NOTE — Progress Notes (Signed)
   PRENATAL VISIT NOTE  Subjective:  Lisa Landry is a 41 y.o. G3P2002 at 2422w1d being seen today for ongoing prenatal care.  She is currently monitored for the following issues for this high-risk pregnancy and has Supervision of high risk pregnancy, antepartum; Multigravida of advanced maternal age; GBS bacteriuria; and Carpal tunnel syndrome during pregnancy on her problem list.  Patient reports no complaints.  Contractions: Irregular. Vag. Bleeding: None.  Movement: Present. Denies leaking of fluid.   The following portions of the patient's history were reviewed and updated as appropriate: allergies, current medications, past family history, past medical history, past social history, past surgical history and problem list. Problem list updated.  Objective:   Vitals:   12/05/16 1133  BP: 112/73  Pulse: 83  Weight: 171 lb (77.6 kg)    Fetal Status: Fetal Heart Rate (bpm): 140   Movement: Present     General:  Alert, oriented and cooperative. Patient is in no acute distress.  Skin: Skin is warm and dry. No rash noted.   Cardiovascular: Normal heart rate noted  Respiratory: Normal respiratory effort, no problems with respiration noted  Abdomen: Soft, gravid, appropriate for gestational age.  Pain/Pressure: Absent     Pelvic: Cervical exam deferred        Extremities: Normal range of motion.  Edema: Trace  Mental Status:  Normal mood and affect. Normal behavior. Normal judgment and thought content.   Assessment and Plan:  Pregnancy: G3P2002 at 8822w1d  1. Supervision of high risk pregnancy, antepartum - Continue weekly BPP at [facility]  2. Elderly multigravida in third trimester   3. GBS bacteriuria   Preterm labor symptoms and general obstetric precautions including but not limited to vaginal bleeding, contractions, leaking of fluid and fetal movement were reviewed in detail with the patient. Please refer to After Visit Summary for other counseling recommendations.  No Follow-up  on file.   Allie BossierMyra C Ernestina Joe, MD

## 2016-12-06 ENCOUNTER — Other Ambulatory Visit: Payer: BC Managed Care – PPO

## 2016-12-12 ENCOUNTER — Encounter (HOSPITAL_COMMUNITY): Payer: Self-pay

## 2016-12-12 ENCOUNTER — Ambulatory Visit (HOSPITAL_COMMUNITY)
Admission: RE | Admit: 2016-12-12 | Discharge: 2016-12-12 | Disposition: A | Discharge status: Home / Self Care | Payer: BLUE CROSS/BLUE SHIELD | Source: Ambulatory Visit | Attending: Obstetrics & Gynecology | Admitting: Obstetrics & Gynecology

## 2016-12-12 ENCOUNTER — Other Ambulatory Visit: Payer: Self-pay | Admitting: Obstetrics & Gynecology

## 2016-12-12 DIAGNOSIS — Z362 Encounter for other antenatal screening follow-up: Secondary | ICD-10-CM | POA: Diagnosis not present

## 2016-12-12 DIAGNOSIS — O0990 Supervision of high risk pregnancy, unspecified, unspecified trimester: Secondary | ICD-10-CM | POA: Diagnosis present

## 2016-12-12 DIAGNOSIS — O09523 Supervision of elderly multigravida, third trimester: Secondary | ICD-10-CM | POA: Diagnosis not present

## 2016-12-12 DIAGNOSIS — Z3A37 37 weeks gestation of pregnancy: Secondary | ICD-10-CM

## 2016-12-13 ENCOUNTER — Other Ambulatory Visit (HOSPITAL_COMMUNITY): Payer: Self-pay | Admitting: *Deleted

## 2016-12-13 DIAGNOSIS — O09523 Supervision of elderly multigravida, third trimester: Secondary | ICD-10-CM

## 2016-12-14 ENCOUNTER — Encounter (HOSPITAL_COMMUNITY): Payer: Self-pay

## 2016-12-14 ENCOUNTER — Inpatient Hospital Stay (HOSPITAL_COMMUNITY)
Admission: AD | Admit: 2016-12-14 | Discharge: 2016-12-17 | DRG: 767 | Disposition: A | Discharge status: Home / Self Care | Payer: BC Managed Care – PPO | Source: Ambulatory Visit | Attending: Obstetrics & Gynecology | Admitting: Obstetrics & Gynecology

## 2016-12-14 ENCOUNTER — Ambulatory Visit (INDEPENDENT_AMBULATORY_CARE_PROVIDER_SITE_OTHER): Payer: BC Managed Care – PPO | Admitting: Obstetrics & Gynecology

## 2016-12-14 VITALS — BP 124/80 | HR 83 | Wt 170.0 lb

## 2016-12-14 DIAGNOSIS — R8271 Bacteriuria: Secondary | ICD-10-CM

## 2016-12-14 DIAGNOSIS — O0990 Supervision of high risk pregnancy, unspecified, unspecified trimester: Secondary | ICD-10-CM

## 2016-12-14 DIAGNOSIS — Z3A38 38 weeks gestation of pregnancy: Secondary | ICD-10-CM

## 2016-12-14 DIAGNOSIS — O4292 Full-term premature rupture of membranes, unspecified as to length of time between rupture and onset of labor: Secondary | ICD-10-CM | POA: Diagnosis present

## 2016-12-14 DIAGNOSIS — O4290 Premature rupture of membranes, unspecified as to length of time between rupture and onset of labor, unspecified weeks of gestation: Secondary | ICD-10-CM | POA: Diagnosis not present

## 2016-12-14 DIAGNOSIS — Z302 Encounter for sterilization: Secondary | ICD-10-CM | POA: Diagnosis not present

## 2016-12-14 DIAGNOSIS — O0993 Supervision of high risk pregnancy, unspecified, third trimester: Secondary | ICD-10-CM

## 2016-12-14 DIAGNOSIS — O99824 Streptococcus B carrier state complicating childbirth: Secondary | ICD-10-CM | POA: Diagnosis present

## 2016-12-14 DIAGNOSIS — Z880 Allergy status to penicillin: Secondary | ICD-10-CM

## 2016-12-14 LAB — POCT NITRAZINE TEST: POCT NITRAZINE (AMNIOSURE): POSITIVE

## 2016-12-14 LAB — CBC
HCT: 39 % (ref 36.0–46.0)
HEMOGLOBIN: 12.6 g/dL (ref 12.0–15.0)
MCH: 26.7 pg (ref 26.0–34.0)
MCHC: 32.3 g/dL (ref 30.0–36.0)
MCV: 82.6 fL (ref 78.0–100.0)
PLATELETS: 196 10*3/uL (ref 150–400)
RBC: 4.72 MIL/uL (ref 3.87–5.11)
RDW: 16.8 % — ABNORMAL HIGH (ref 11.5–15.5)
WBC: 6.5 10*3/uL (ref 4.0–10.5)

## 2016-12-14 MED ORDER — ONDANSETRON HCL 4 MG/2ML IJ SOLN
4.0000 mg | Freq: Four times a day (QID) | INTRAMUSCULAR | Status: DC | PRN
  Administered 2016-12-15: 4 mg via INTRAVENOUS
  Filled 2016-12-14: qty 2

## 2016-12-14 MED ORDER — ACETAMINOPHEN 325 MG PO TABS: 650.0000 mg | ORAL_TABLET | ORAL | Status: DC | PRN

## 2016-12-14 MED ORDER — OXYCODONE-ACETAMINOPHEN 5-325 MG PO TABS: 2.0000 | ORAL_TABLET | ORAL | Status: DC | PRN

## 2016-12-14 MED ORDER — FENTANYL CITRATE (PF) 100 MCG/2ML IJ SOLN: 100.0000 ug | INTRAMUSCULAR | Status: DC | PRN

## 2016-12-14 MED ORDER — OXYTOCIN BOLUS FROM INFUSION
500.0000 mL | Freq: Once | INTRAVENOUS | Status: AC
  Administered 2016-12-15: 500 mL via INTRAVENOUS

## 2016-12-14 MED ORDER — CLINDAMYCIN PHOSPHATE 900 MG/50ML IV SOLN
900.0000 mg | Freq: Three times a day (TID) | INTRAVENOUS | Status: DC
  Administered 2016-12-15 (×2): 900 mg via INTRAVENOUS
  Filled 2016-12-14 (×3): qty 50

## 2016-12-14 MED ORDER — LACTATED RINGERS IV SOLN: 500.0000 mL | INTRAVENOUS | Status: DC | PRN

## 2016-12-14 MED ORDER — OXYCODONE-ACETAMINOPHEN 5-325 MG PO TABS: 1.0000 | ORAL_TABLET | ORAL | Status: DC | PRN

## 2016-12-14 MED ORDER — SOD CITRATE-CITRIC ACID 500-334 MG/5ML PO SOLN
30.0000 mL | ORAL | Status: DC | PRN
Start: 2016-12-14 — End: 2016-12-15

## 2016-12-14 MED ORDER — LIDOCAINE HCL (PF) 1 % IJ SOLN
30.0000 mL | INTRAMUSCULAR | Status: DC | PRN
  Filled 2016-12-14: qty 30

## 2016-12-14 MED ORDER — OXYTOCIN 40 UNITS IN LACTATED RINGERS INFUSION - SIMPLE MED: 2.5000 [IU]/h | INTRAVENOUS | Status: DC

## 2016-12-14 MED ORDER — OXYTOCIN 40 UNITS IN LACTATED RINGERS INFUSION - SIMPLE MED
1.0000 m[IU]/min | INTRAVENOUS | Status: DC
  Administered 2016-12-15: 2 m[IU]/min via INTRAVENOUS
  Filled 2016-12-14: qty 1000

## 2016-12-14 MED ORDER — LACTATED RINGERS IV SOLN
INTRAVENOUS | Status: DC
  Administered 2016-12-14 – 2016-12-15 (×2): via INTRAVENOUS

## 2016-12-14 MED ORDER — TERBUTALINE SULFATE 1 MG/ML IJ SOLN: 0.2500 mg | Freq: Once | INTRAMUSCULAR | Status: DC | PRN

## 2016-12-14 NOTE — Progress Notes (Signed)
   PRENATAL VISIT NOTE  Subjective:  Lisa Landry is a 41 y.o. G3P2002 at 3695w3d being seen today for ongoing prenatal care.  She is currently monitored for the following issues for this low-risk pregnancy and has Supervision of high risk pregnancy, antepartum; Multigravida of advanced maternal age; GBS bacteriuria; and Carpal tunnel syndrome during pregnancy on her problem list.  Patient reports slow continuous leakage of fluid since 12/13/16 0300.  Clear liquid. Not related to urination, feels she broke her bag of water. Not in labor. Contractions: Irregular. Vag. Bleeding: None.  Movement: Present.   The following portions of the patient's history were reviewed and updated as appropriate: allergies, current medications, past family history, past medical history, past social history, past surgical history and problem list. Problem list updated.  Objective:   Vitals:   12/14/16 1155  BP: 124/80  Pulse: 83  Weight: 170 lb (77.1 kg)    Fetal Status: Fetal Heart Rate (bpm): 140   Movement: Present  Presentation: Vertex  General:  Alert, oriented and cooperative. Patient is in no acute distress.  Skin: Skin is warm and dry. No rash noted.   Cardiovascular: Normal heart rate noted  Respiratory: Normal respiratory effort, no problems with respiration noted  Abdomen: Soft, gravid, appropriate for gestational age.  Pain/Pressure: Present     Pelvic: Cervical exam performed Dilation: 3 Effacement (%): 70 Station: -2 +Pool +Nitrazine  Extremities: Normal range of motion.  Edema: Trace  Mental Status:  Normal mood and affect. Normal behavior. Normal judgment and thought content.   Assessment and Plan:  Pregnancy: G3P2002 at 5795w3d  1. Prolonged premature rupture of membranes Patient sent to L&D, staff on call notified. Needs GBS prophylaxis.  2. GBS bacteriuria Needs intrapartum treatment with Clindamycin  3. Supervision of high risk pregnancy, antepartum Please refer to After Visit  Summary for other counseling recommendations.  Return in about 4 weeks (around 01/11/2017) for Postpartum check.   Jaynie CollinsUgonna Deette Revak, MD

## 2016-12-14 NOTE — Patient Instructions (Signed)
Return to clinic for any scheduled appointments or obstetric concerns, or go to [facility] for evaluation  

## 2016-12-14 NOTE — H&P (Signed)
OBSTETRIC ADMISSION HISTORY AND PHYSICAL  Lisa Landry is a 40 y.o. female G3P2002 with IUP at [redacted]w[redacted]d by 10wk [place] presenting for SROM @ 0230 on 8/9 .This was confirmed at the [facility] office in the late morning on 8/10. She reports +FMs, no VB, no blurry vision, headaches or peripheral edema, and RUQ pain.  She plans on breast feeding. She request BTL for birth control. She received her [facility]   Dating: By 10wk --->  Estimated Date of Delivery: 12/25/16  Sono:    @[redacted]w[redacted]d , CWD, normal anatomy, cephalic presentation,  2805g, 58% EFW   Prenatal History/Complications:  Past Medical History: Past Medical History:  Diagnosis Date  . Abnormal Pap smear   . Vaginal Pap smear, abnormal     Past Surgical History: Past Surgical History:  Procedure Laterality Date  . COLPOSCOPY     abnormal pap.  . WISDOM TOOTH EXTRACTION     x2    Obstetrical History: OB History    Gravida Para Term Preterm AB Living   3 2 2  0 0 2   SAB TAB Ectopic Multiple Live Births   0 0 0 0 2      Social History: Social History   Social History  . Marital status: Single    Spouse name: N/A  . Number of children: N/A  . Years of education: N/A   Social History Main Topics  . Smoking status: Never Smoker  . Smokeless tobacco: Never Used  . Alcohol use No  . Drug use: No  . Sexual activity: Yes    Birth control/ protection: None   Other Topics Concern  . None   Social History Narrative  . None    Family History: Family History  Problem Relation Age of Onset  . Hypothyroidism Mother   . Hypothyroidism Maternal Aunt     Allergies: Allergies  Allergen Reactions  . Amoxil [Amoxicillin] Hives    Has patient had a PCN reaction causing immediate rash, facial/tongue/throat swelling, SOB or lightheadedness with hypotension: Unknown Has patient had a PCN reaction causing severe rash involving mucus membranes or skin necrosis: No Has patient had a PCN reaction that required  hospitalization: No Has patient had a PCN reaction occurring within the last 10 years: Yes If all of the above answers are "NO", then may proceed with Cephalosporin use.      Prescriptions Prior to Admission  Medication Sig Dispense Refill Last Dose  . ferrous sulfate (FERROUSUL) 325 (65 FE) MG tablet Take 1 tablet (325 mg total) by mouth 2 (two) times daily. 60 tablet 1 12/14/2016 at Unknown time  . Prenatal Vit-Fe Fumarate-FA (PRENATAL MULTIVITAMIN) TABS tablet Take 1 tablet by mouth daily at 12 noon.   12/14/2016 at Unknown time     Review of Systems   All systems reviewed and negative except as stated in HPI  Blood pressure 125/67, pulse 78, temperature 98 F (36.7 C), temperature source Oral, resp. rate 18, height 5' (1.524 m), weight 77.6 kg (171 lb), last menstrual period 03/25/2016, currently breastfeeding. General appearance: alert, cooperative and no distress Lungs: clear to auscultation bilaterally Heart: regular rate and rhythm Abdomen: soft, non-tender; bowel sounds normal Pelvic: defered Extremities: Homans sign is negative, no sign of DVT  Presentation: cephalic Fetal monitoringBaseline: 130 bpm, Variability: Good {> 6 bpm), Accelerations: Reactive and Decelerations: Absent Uterine activityFrequency: occasional, 40-90s     Prenatal labs: ABO, Rh: O/Positive/-- (01/24 0940) Antibody: Negative (01/24 0940) Rubella: 1.66 (01/24 0940) RPR: Non  Reactive (05/29 [id number])  HBsAg: Negative (01/24 0940)  HIV:     Negative  GBS:   positive in urine 1 hr Glucola 122 Genetic screening  normal Anatomy US normal  Prenatal Transfer Tool  Maternal Diabetes: No Genetic Screening: Normal Maternal Ultrasounds/Referrals: Normal Fetal Ultrasounds or other Referrals:  None Maternal Substance Abuse:  No Significant Maternal Medications:  None Significant Maternal Lab Results: Lab values include: Group B Strep positive  Results for orders placed or performed in visit on  12/14/16 (from the past 24 hour(s))  Nitrazine Test   Collection Time: 12/14/16 12:30 PM  Result Value Ref Range   POCT Nitrazine (amniosure) Positive = ruptured amniotic membanes     Patient Active Problem List   Diagnosis Date Noted  . PROM (premature rupture of membranes) 12/14/2016  . Carpal tunnel syndrome during pregnancy 11/13/2016  . GBS bacteriuria 06/08/2016  . Supervision of high risk pregnancy, antepartum 05/30/2016  . Multigravida of advanced maternal age 27/24/2018    Assessment/Plan:  Lisa Landry is a 41 y.o. G3P2002 at 3060w3d here for IOL for SROM that occurred 0230 12/13/16.   #Labor: Not in active labor, agumenting with pitocin  #Pain: Will plan on getting epidural  #FWB: Cat #ID:  GBS + - treating with cilndamycin  #MOF: breast #MOC:BTL  #Circ:  Yes   Ignacia MarvelKendrick C White, MD  12/14/2016, 10:57 PM   CNM attestation:  I have seen and examined this patient; I agree with above documentation in the resident's note.   Lisa Landry is a 41 y.o. [id number] here for IOL due to PROM  PE: BP 124/70   Pulse 71   Temp 98.1 F (36.7 C) (Oral)   Resp 16   Ht 5' (1.524 m)   Wt 77.6 kg (171 lb)   LMP 03/25/2016   BMI 33.40 kg/m  Gen: calm comfortable, NAD Resp: normal effort, no distress Abd: gravid  ROS, labs, PMH reviewed  [facility] method [place] for GBS ppx Anticipate SVD  Tulio Facundo CNM 12/15/2016, 8:50 AM

## 2016-12-14 NOTE — MAU Note (Signed)
Water broke Water broke at [employer]am on Thursday. [facility] Macon LargeAnyanwu confirmed SROM. Advised pt to go to hospital. Pt is here now denies any contractions and reports good fetal movement.

## 2016-12-15 ENCOUNTER — Inpatient Hospital Stay (HOSPITAL_COMMUNITY): Payer: BC Managed Care – PPO | Admitting: Anesthesiology

## 2016-12-15 ENCOUNTER — Encounter (HOSPITAL_COMMUNITY): Payer: Self-pay

## 2016-12-15 ENCOUNTER — Encounter (HOSPITAL_COMMUNITY)
Admission: AD | Disposition: A | Discharge status: Home / Self Care | Payer: Self-pay | Source: Ambulatory Visit | Attending: Obstetrics & Gynecology

## 2016-12-15 DIAGNOSIS — Z302 Encounter for sterilization: Secondary | ICD-10-CM

## 2016-12-15 DIAGNOSIS — Z3A38 38 weeks gestation of pregnancy: Secondary | ICD-10-CM

## 2016-12-15 DIAGNOSIS — O99824 Streptococcus B carrier state complicating childbirth: Secondary | ICD-10-CM

## 2016-12-15 HISTORY — PX: TUBAL LIGATION: SHX77

## 2016-12-15 LAB — TYPE AND SCREEN
ABO/RH(D): O POS
Antibody Screen: NEGATIVE

## 2016-12-15 LAB — ABO/RH: ABO/RH(D): O POS

## 2016-12-15 LAB — RPR: RPR: NONREACTIVE

## 2016-12-15 LAB — HIV ANTIBODY (ROUTINE TESTING W REFLEX): HIV SCREEN 4TH GENERATION: NONREACTIVE

## 2016-12-15 SURGERY — LIGATION, FALLOPIAN TUBE, POSTPARTUM
Anesthesia: Spinal | Laterality: Bilateral

## 2016-12-15 MED ORDER — MEASLES, MUMPS & RUBELLA VAC ~~LOC~~ INJ
0.5000 mL | INJECTION | Freq: Once | SUBCUTANEOUS | Status: DC
  Filled 2016-12-15: qty 0.5

## 2016-12-15 MED ORDER — IBUPROFEN 600 MG PO TABS
600.0000 mg | ORAL_TABLET | Freq: Four times a day (QID) | ORAL | Status: DC
  Administered 2016-12-15: 600 mg via ORAL
  Filled 2016-12-15: qty 1

## 2016-12-15 MED ORDER — ONDANSETRON HCL 4 MG/2ML IJ SOLN
INTRAMUSCULAR | Status: AC
  Filled 2016-12-15: qty 2

## 2016-12-15 MED ORDER — MIDAZOLAM HCL 2 MG/2ML IJ SOLN
INTRAMUSCULAR | Status: AC
  Filled 2016-12-15: qty 2

## 2016-12-15 MED ORDER — SENNOSIDES-DOCUSATE SODIUM 8.6-50 MG PO TABS
2.0000 | ORAL_TABLET | ORAL | Status: DC
  Administered 2016-12-17: 2 via ORAL
  Filled 2016-12-15 (×2): qty 2

## 2016-12-15 MED ORDER — OXYCODONE-ACETAMINOPHEN 5-325 MG PO TABS: 1.0000 | ORAL_TABLET | ORAL | Status: DC | PRN

## 2016-12-15 MED ORDER — DIPHENHYDRAMINE HCL 25 MG PO CAPS: 25.0000 mg | ORAL_CAPSULE | Freq: Four times a day (QID) | ORAL | Status: DC | PRN

## 2016-12-15 MED ORDER — TETANUS-DIPHTH-ACELL PERTUSSIS 5-2.5-18.5 LF-MCG/0.5 IM SUSP
0.5000 mL | Freq: Once | INTRAMUSCULAR | Status: AC
  Administered 2016-12-16: 0.5 mL via INTRAMUSCULAR

## 2016-12-15 MED ORDER — DIBUCAINE 1 % RE OINT
1.0000 | TOPICAL_OINTMENT | RECTAL | Status: DC | PRN
Start: 2016-12-15 — End: 2016-12-17

## 2016-12-15 MED ORDER — ONDANSETRON HCL 4 MG/2ML IJ SOLN: 4.0000 mg | INTRAMUSCULAR | Status: DC | PRN

## 2016-12-15 MED ORDER — FERROUS SULFATE 325 (65 FE) MG PO TABS
325.0000 mg | ORAL_TABLET | Freq: Two times a day (BID) | ORAL | Status: DC
  Administered 2016-12-15 – 2016-12-17 (×5): 325 mg via ORAL
  Filled 2016-12-15 (×5): qty 1

## 2016-12-15 MED ORDER — PRENATAL MULTIVITAMIN CH: 1.0000 | ORAL_TABLET | Freq: Every day | ORAL | Status: DC

## 2016-12-15 MED ORDER — DOCUSATE SODIUM 100 MG PO CAPS
100.0000 mg | ORAL_CAPSULE | Freq: Two times a day (BID) | ORAL | Status: DC
  Administered 2016-12-16 – 2016-12-17 (×4): 100 mg via ORAL
  Filled 2016-12-15 (×4): qty 1

## 2016-12-15 MED ORDER — MAGNESIUM HYDROXIDE 400 MG/5ML PO SUSP: 30.0000 mL | ORAL | Status: DC | PRN

## 2016-12-15 MED ORDER — SODIUM CHLORIDE 0.9% FLUSH
3.0000 mL | Freq: Two times a day (BID) | INTRAVENOUS | Status: DC
  Administered 2016-12-16: 3 mL via INTRAVENOUS

## 2016-12-15 MED ORDER — BENZOCAINE-MENTHOL 20-0.5 % EX AERO: 1.0000 "application " | INHALATION_SPRAY | CUTANEOUS | Status: DC | PRN

## 2016-12-15 MED ORDER — METOCLOPRAMIDE HCL 5 MG/ML IJ SOLN: 10.0000 mg | Freq: Once | INTRAMUSCULAR | Status: DC | PRN

## 2016-12-15 MED ORDER — SIMETHICONE 80 MG PO CHEW: 80.0000 mg | CHEWABLE_TABLET | ORAL | Status: DC | PRN

## 2016-12-15 MED ORDER — HYDROCODONE-ACETAMINOPHEN 7.5-325 MG PO TABS: 1.0000 | ORAL_TABLET | Freq: Once | ORAL | Status: DC | PRN

## 2016-12-15 MED ORDER — ACETAMINOPHEN 325 MG PO TABS
650.0000 mg | ORAL_TABLET | ORAL | Status: DC | PRN
  Administered 2016-12-17 (×2): 650 mg via ORAL

## 2016-12-15 MED ORDER — BUPIVACAINE HCL (PF) 0.25 % IJ SOLN
INTRAMUSCULAR | Status: DC | PRN
  Administered 2016-12-15: 10 mL

## 2016-12-15 MED ORDER — ZOLPIDEM TARTRATE 5 MG PO TABS: 5.0000 mg | ORAL_TABLET | Freq: Every evening | ORAL | Status: DC | PRN

## 2016-12-15 MED ORDER — ONDANSETRON HCL 4 MG/2ML IJ SOLN
INTRAMUSCULAR | Status: DC | PRN
  Administered 2016-12-15: 4 mg via INTRAVENOUS

## 2016-12-15 MED ORDER — ONDANSETRON HCL 4 MG PO TABS: 4.0000 mg | ORAL_TABLET | ORAL | Status: DC | PRN

## 2016-12-15 MED ORDER — OXYTOCIN 40 UNITS IN LACTATED RINGERS INFUSION - SIMPLE MED
INTRAVENOUS | Status: DC | PRN
  Administered 2016-12-15: 100 mL via INTRAVENOUS

## 2016-12-15 MED ORDER — SODIUM CHLORIDE 0.9% FLUSH: 3.0000 mL | INTRAVENOUS | Status: DC | PRN

## 2016-12-15 MED ORDER — WITCH HAZEL-GLYCERIN EX PADS: 1.0000 "application " | MEDICATED_PAD | CUTANEOUS | Status: DC | PRN

## 2016-12-15 MED ORDER — KETOROLAC TROMETHAMINE 30 MG/ML IJ SOLN
INTRAMUSCULAR | Status: DC | PRN
  Administered 2016-12-15: 30 mg via INTRAVENOUS

## 2016-12-15 MED ORDER — OXYCODONE-ACETAMINOPHEN 5-325 MG PO TABS: 2.0000 | ORAL_TABLET | ORAL | Status: DC | PRN

## 2016-12-15 MED ORDER — KETOROLAC TROMETHAMINE 30 MG/ML IJ SOLN
INTRAMUSCULAR | Status: AC
  Filled 2016-12-15: qty 1

## 2016-12-15 MED ORDER — IBUPROFEN 100 MG/5ML PO SUSP
600.0000 mg | Freq: Four times a day (QID) | ORAL | Status: DC
  Administered 2016-12-16 – 2016-12-17 (×6): 600 mg via ORAL
  Filled 2016-12-15 (×10): qty 30

## 2016-12-15 MED ORDER — MEPERIDINE HCL 25 MG/ML IJ SOLN: 6.2500 mg | INTRAMUSCULAR | Status: DC | PRN

## 2016-12-15 MED ORDER — OXYTOCIN 40 UNITS IN LACTATED RINGERS INFUSION - SIMPLE MED: 2.5000 [IU]/h | INTRAVENOUS | Status: DC | PRN

## 2016-12-15 MED ORDER — SODIUM CHLORIDE 0.9 % IV SOLN: 250.0000 mL | INTRAVENOUS | Status: DC | PRN

## 2016-12-15 MED ORDER — BUPIVACAINE HCL (PF) 0.25 % IJ SOLN
INTRAMUSCULAR | Status: AC
  Filled 2016-12-15: qty 30

## 2016-12-15 MED ORDER — FENTANYL CITRATE (PF) 100 MCG/2ML IJ SOLN: 25.0000 ug | INTRAMUSCULAR | Status: DC | PRN

## 2016-12-15 MED ORDER — COCONUT OIL OIL: 1.0000 "application " | TOPICAL_OIL | Status: DC | PRN

## 2016-12-15 MED ORDER — LACTATED RINGERS IV SOLN
INTRAVENOUS | Status: DC | PRN
  Administered 2016-12-15: 11:00:00 via INTRAVENOUS

## 2016-12-15 MED ORDER — MIDAZOLAM HCL 2 MG/2ML IJ SOLN
INTRAMUSCULAR | Status: DC | PRN
  Administered 2016-12-15: 2 mg via INTRAVENOUS

## 2016-12-15 MED ORDER — BUPIVACAINE IN DEXTROSE 0.75-8.25 % IT SOLN
INTRATHECAL | Status: DC | PRN
  Administered 2016-12-15: 13 mg via INTRATHECAL

## 2016-12-15 SURGICAL SUPPLY — 19 items
CLIP FILSHIE TUBAL LIGA STRL (Clip) ×1 IMPLANT
CLOTH BEACON ORANGE TIMEOUT ST (SAFETY) ×2 IMPLANT
DRSG OPSITE POSTOP 3X4 (GAUZE/BANDAGES/DRESSINGS) ×2 IMPLANT
DURAPREP 26ML APPLICATOR (WOUND CARE) ×2 IMPLANT
GLOVE BIOGEL PI IND STRL 7.0 (GLOVE) ×2 IMPLANT
GLOVE BIOGEL PI INDICATOR 7.0 (GLOVE) ×2
GLOVE ECLIPSE 7.0 STRL STRAW (GLOVE) ×4 IMPLANT
GOWN STRL REUS W/TWL LRG LVL3 (GOWN DISPOSABLE) ×4 IMPLANT
NEEDLE HYPO 22GX1.5 SAFETY (NEEDLE) ×2 IMPLANT
NS IRRIG 1000ML POUR BTL (IV SOLUTION) ×2 IMPLANT
PACK ABDOMINAL MINOR (CUSTOM PROCEDURE TRAY) ×2 IMPLANT
PROTECTOR NERVE ULNAR (MISCELLANEOUS) ×2 IMPLANT
SPONGE LAP 4X18 X RAY DECT (DISPOSABLE) ×1 IMPLANT
SUT VIC AB 0 CT1 27 (SUTURE) ×2
SUT VIC AB 0 CT1 27XBRD ANBCTR (SUTURE) ×1 IMPLANT
SUT VICRYL 4-0 PS2 18IN ABS (SUTURE) ×2 IMPLANT
SYR CONTROL 10ML LL (SYRINGE) ×2 IMPLANT
TOWEL OR 17X24 6PK STRL BLUE (TOWEL DISPOSABLE) ×4 IMPLANT
TRAY FOLEY CATH SILVER 14FR (SET/KITS/TRAYS/PACK) ×2 IMPLANT

## 2016-12-15 NOTE — Anesthesia Preprocedure Evaluation (Addendum)
Anesthesia Evaluation  Patient identified by MRN, date of birth, ID band Patient awake    Reviewed: Allergy & Precautions, H&P , NPO status , Patient's Chart, lab work & pertinent test results  Airway Mallampati: II  TM Distance: >3 FB Neck ROM: full    Dental no notable dental hx. (+) Teeth Intact   Pulmonary neg pulmonary ROS,    Pulmonary exam normal breath sounds clear to auscultation       Cardiovascular negative cardio ROS Normal cardiovascular exam Rhythm:regular Rate:Normal     Neuro/Psych  Neuromuscular disease negative psych ROS   GI/Hepatic negative GI ROS, Neg liver ROS,   Endo/Other  negative endocrine ROS  Renal/GU negative Renal ROS  negative genitourinary   Musculoskeletal negative musculoskeletal ROS (+)   Abdominal   Peds  Hematology negative hematology ROS (+)   Anesthesia Other Findings   Reproductive/Obstetrics (+) Breast feeding  Desires PPTL                             Anesthesia Physical Anesthesia Plan  ASA: II  Anesthesia Plan: Spinal   Post-op Pain Management:    Induction:   PONV Risk Score and Plan: 4 or greater and Ondansetron, Dexamethasone, Scopolamine patch - Pre-op, Promethazine and Treatment may vary due to age or medical condition  Airway Management Planned:   Additional Equipment:   Intra-op Plan:   Post-operative Plan:   Informed Consent: I have reviewed the patients History and Physical, chart, labs and discussed the procedure including the risks, benefits and alternatives for the proposed anesthesia with the patient or authorized representative who has indicated his/her understanding and acceptance.   Dental Advisory Given  Plan Discussed with: Anesthesiologist, CRNA and Surgeon  Anesthesia Plan Comments:         Anesthesia Quick Evaluation

## 2016-12-15 NOTE — OR Nursing (Signed)
Called for patient to be brought to OR desk.

## 2016-12-15 NOTE — Progress Notes (Signed)
Labor Progress Note Lisa Landry is a 41 y.o. G3P2002 at 493w4d presented for SROM labor S: Feeling contractions regularly. Family left for work  O:  BP 124/78   Pulse 87   Temp 98.7 F (37.1 C) (Oral)   Resp 18   Ht 5' (1.524 m)   Wt 171 lb (77.6 kg)   LMP 03/25/2016   BMI 33.40 kg/m  EFM: 125/Moderate/+ accel Few variable  CVE: Dilation: 5 Effacement (%): 70 Station: -1 Presentation: Vertex Exam by:: Dr Sedalia Mutaox   A&P: 41 y.o. [id number] 1593w4d adotted for SROM #Labor: Progressing well. Continue with augmentation.  #Pain: Does not desire epidural. Made aware of pain medications.  #FWB: Cat2 due to few variable decels. Overall reassuring.  #GBS positive -Clinda   John Giovanniorey P Ayeshia Coppin, MD 9:45 AM

## 2016-12-15 NOTE — Op Note (Signed)
Desmond LopeKenisha Bleich  12/14/2016 - 12/15/2016  PREOPERATIVE DIAGNOSIS:  Multiparity, undesired fertility  POSTOPERATIVE DIAGNOSIS:  Multiparity, undesired fertility  PROCEDURE:  Postpartum Bilateral Tubal Sterilization using Filshie Clips   ANESTHESIA:  Epidural and local analgesia using 0.25% Marcaine  COMPLICATIONS:  None immediate.  ESTIMATED BLOOD LOSS: 5 ml.  INDICATIONS: 41 y.o. [id number]  with undesired fertility,status post vaginal delivery, desires permanent sterilization.  Other reversible forms of contraception were discussed with patient; she declines all other modalities. Risks of procedure discussed with patient including but not limited to: risk of regret, permanence of method, bleeding, infection, injury to surrounding organs and need for additional procedures.  Failure risk of 0.5-1% with increased risk of ectopic gestation if pregnancy occurs was also discussed with patient.     FINDINGS:  Normal uterus, tubes, and ovaries.  PROCEDURE DETAILS: The patient was taken to the operating room where her spinal anesthesia was dosed up to surgical level and found to be adequate.  She was then placed in a supine position and prepped and draped in the usual sterile fashion.  After an adequate timeout was performed, attention was turned to the patient's abdomen where a small transverse skin incision was made under the umbilical fold. The incision was taken down to the layer of fascia using the scalpel, and fascia was incised, and extended bilaterally. The peritoneum was entered in a sharp fashion. The patient was placed in Trendelenburg.  A moist lap pad was used to move omentum and bowel away until the left fallopian tube was identified and grasped with a Babcock clamp, and followed out to the fimbriated end.  A Filshie clip was placed on the left fallopian tube about 2 cm from the cornu.  A similar process was carried out on the right side allowing for bilateral tubal sterilization.  Good  hemostasis was noted overall.  Local analgesia was injected into both Filshie application sites.The instruments were then removed from the patient's abdomen and the fascial incision was repaired with 0 Vicryl, and the skin was closed with a 4-0 Vicryl subcuticular stitch. The patient tolerated the procedure well.  Sponge, lap, and needle counts were correct times two.  The patient was then taken to the recovery room awake, extubated and in stable condition.  Caryl AdaJazma Phelps, DO OB Fellow Faculty Practice, [facility] 12/15/2016, 12:33 PM

## 2016-12-15 NOTE — Transfer of Care (Signed)
Immediate Anesthesia Transfer of Care Note  Patient: Lisa LopeKenisha Landry  Procedure(s) Performed: Procedure(s): POST PARTUM TUBAL LIGATION WITH FILSHIE CLIPS (Bilateral)  Patient Location: PACU  Anesthesia Type:Spinal  Level of Consciousness: awake and alert   Airway & Oxygen Therapy: Patient Spontanous Breathing  Post-op Assessment: Report given to RN and Post -op Vital signs reviewed and stable  Post vital signs: Reviewed  Last Vitals:  Vitals:   12/15/16 1031 12/15/16 1046  BP: 125/66 123/79  Pulse: 90 77  Resp: 16 16  Temp:      Last Pain:  Vitals:   12/15/16 0906  TempSrc:   PainSc: 5          Complications: No apparent anesthesia complications

## 2016-12-15 NOTE — Anesthesia Postprocedure Evaluation (Signed)
Anesthesia Post Note  Patient: Lisa LopeKenisha Landry  Procedure(s) Performed: Procedure(s) (LRB): POST PARTUM TUBAL LIGATION WITH FILSHIE CLIPS (Bilateral)     Patient location during evaluation: PACU Anesthesia Type: Spinal Level of consciousness: oriented and awake and alert Pain management: pain level controlled Vital Signs Assessment: post-procedure vital signs reviewed and stable Respiratory status: spontaneous breathing, respiratory function stable and nonlabored ventilation Cardiovascular status: blood pressure returned to baseline and stable Postop Assessment: no headache, no backache, spinal receding, patient able to bend at knees and no signs of nausea or vomiting Anesthetic complications: no    Last Vitals:  Vitals:   12/15/16 1315 12/15/16 1345  BP: 111/70 109/71  Pulse: 75 74  Resp: 15 14  Temp:  (!) 36.4 C  SpO2: 96% 98%    Last Pain:  Vitals:   12/15/16 0906  TempSrc:   PainSc: 5    Pain Goal:                 [name] A.

## 2016-12-15 NOTE — Progress Notes (Signed)
Patient ID: Lisa LopeKenisha Landry, female   DOB: 10/06/1975, 41 y.o.   MRN: [id number]  Has been receiving Pit all night; not feeing discomfort at all; getting Clinda  VSS, afeb FHR 125-135, +accels, no decels Ctx q 1-2 mins w/ Pit at 314mu/min Cx deferred (was 3/70)  IUP@term  PROM Cx favorable GBS pos  Consider possibility of forebag that needs ruptured; signed out to oncoming team  Cam HaiSHAW, [name] 12/15/2016 8:48 AM

## 2016-12-15 NOTE — Anesthesia Pain Management Evaluation Note (Signed)
  CRNA Pain Management Visit Note  Patient: Lisa Landry, 41 y.o., female  "Hello [facility] am a [facility]. We have an anesthesia team available at all times to provide care throughout the hospital, including epidural management and anesthesia for C-section. I don't know your plan for the delivery whether it a natural birth, water birth, IV sedation, nitrous supplementation, doula or epidural, but we want to meet your pain goals."   1.Was your pain managed to your expectations on prior hospitalizations?   Yes   2.What is your expectation for pain management during this hospitalization?     Labor support without medications  3.How can we help you reach that goal? Patient wants natural   Record the patient's initial score and the patient's pain goal.   Pain: 1  Pain Goal: 10 [facility] wants you to be able to say your pain was always managed very well.  Rica RecordsICKELTON,Huan Pollok 12/15/2016

## 2016-12-15 NOTE — Anesthesia Procedure Notes (Signed)
Spinal  Patient location during procedure: OR Start time: 12/15/2016 11:40 AM Staffing Anesthesiologist: Mal AmabileFOSTER, Krystyne Tewksbury Performed: anesthesiologist  Preanesthetic Checklist Completed: patient identified, site marked, surgical consent, pre-op evaluation, timeout performed, IV checked, risks and benefits discussed and monitors and equipment checked Spinal Block Patient position: sitting Prep: site prepped and draped and DuraPrep Patient monitoring: heart rate, cardiac monitor, continuous pulse ox and blood pressure Approach: midline Location: L3-4 Injection technique: single-shot Needle Needle type: Pencan  Needle gauge: 24 G Needle length: 9 cm Needle insertion depth: 5 cm Assessment Sensory level: T4 Additional Notes Patient tolerated procedure well. Adequate sensory level.

## 2016-12-15 NOTE — Interval H&P Note (Signed)
History and Physical Interval Note:  12/15/2016 11:22 AM  Lisa LopeKenisha Snowberger  has presented today for surgery, with the diagnosis of PP BTL now s/p SVD   The various methods of treatment have been discussed with the patient and family. After consideration of risks, benefits and other options for treatment, the patient has consented to  Procedure(s): POST PARTUM TUBAL LIGATION WITH FILSHIE CLIPS (Bilateral) as a surgical intervention .  The patient's history has been reviewed, patient examined, no change in status, stable for surgery. Patient counseled, r.e. Risks benefits of BTL, including permanency of procedure, risk of failure(1:100), increased risk of ectopic.  Patient verbalized understanding and desires to proceed   I have reviewed the patient's chart and labs.  Questions were answered to the patient's satisfaction.     Reva Boresanya S Stehanie Ekstrom

## 2016-12-16 MED ORDER — ACETAMINOPHEN 325 MG PO TABS: 650.0000 mg | ORAL_TABLET | ORAL | 0 refills | Status: DC | PRN

## 2016-12-16 MED ORDER — COMPLETENATE 29-1 MG PO CHEW
1.0000 | CHEWABLE_TABLET | Freq: Every day | ORAL | Status: DC
  Administered 2016-12-16: 1 via ORAL
  Filled 2016-12-16 (×2): qty 1

## 2016-12-16 MED ORDER — IBUPROFEN 800 MG PO TABS: 800.0000 mg | ORAL_TABLET | Freq: Three times a day (TID) | ORAL | 0 refills | Status: DC | PRN

## 2016-12-16 NOTE — Lactation Note (Signed)
This note was copied from a baby's chart. Lactation Consultation Note  Patient Name: Lisa Landry ZOXWR'UToday's Date: 12/16/2016 Reason for consult: Initial assessment   P3, Mother breastfed last child for 9 months. Mother states she can hand express and has viewed drops. Denies questions or concerns.  Suggest she call if she would like assistance w/ latchig. Mom encouraged to feed baby 8-12 times/24 hours and with feeding cues.  Discussed cluster feeding.   Mom made aware of O/P services, breastfeeding support groups, community resources, and our phone # for post-discharge questions.     Maternal Data Has patient been taught Hand Expression?: Yes Does the patient have breastfeeding experience prior to this delivery?: Yes  Feeding Feeding Type: Breast Fed Length of feed: 25 min  LATCH Score                   Interventions    Lactation Tools Discussed/Used     Consult Status Consult Status: Follow-up Date: 12/17/16 Follow-up type: In-patient    [name], [name] 12/16/2016, 11:13 AM

## 2016-12-17 NOTE — Discharge Instructions (Signed)

## 2016-12-17 NOTE — Lactation Note (Signed)
This note was copied from a baby's chart. Lactation Consultation Note  Patient Name: Lisa Desmond LopeKenisha Becherer RUEAV'WToday's Date: 12/17/2016 Reason for consult: Follow-up assessment  Baby is 248 hours old  LC reviewed doc flow sheets.  Per mom breast feeding is going well , denies soreness.  Sore nipple and engorgement prevention and tx reviewed.  Per mom has  DEBP at home.  Mother informed of post-discharge support and given phone number to the lactation department, including services for phone call assistance; out-patient appointments; and breastfeeding support group. List of other breastfeeding resources in the community given in the handout. Encouraged mother to call for problems or concerns related to breastfeeding.   Maternal Data    Feeding Feeding Type: Breast Fed Length of feed: 17 min  LATCH Score                   Interventions Interventions: Breast feeding basics reviewed  Lactation Tools Discussed/Used     Consult Status Consult Status: Complete Date: 12/17/16    Lisa Landry 12/17/2016, 10:31 AM

## 2016-12-17 NOTE — Discharge Summary (Signed)
       OB Discharge Summary  Patient Name: Lisa Landry DOB: 07/09/1975 MRN: [id number]  Date of admission: 12/14/2016 Delivering MD: John GiovanniOX, COREY P   Date of discharge: 12/17/2016  Admitting diagnosis: 38WKS WATER BROKE YESTERDAY NO CTX DOC ADVISED TO COME OVER PP BTL  Intrauterine pregnancy: 6836w4d     Secondary diagnosis:Active Problems:   Normal vaginal delivery   PROM (premature rupture of membranes)  Additional problems:AMA    Discharge diagnosis: Term Pregnancy Delivered                                                                     Post partum procedures:postpartum tubal ligation  Augmentation: Pitocin  Complications: None  Hospital course:  Onset of Labor With Vaginal Delivery     41 y.o. yo [id number] at 536w4d was admitted in Latent Labor on 12/14/2016. Patient had an uncomplicated labor course as follows:  Membrane Rupture Time/Date: 3:00 AM ,12/13/2016   Intrapartum Procedures: Episiotomy: None [1]                                         Lacerations:     Patient had a delivery of a Viable infant. 12/15/2016  Information for the patient's newborn:  Lisa Landry, Lisa Landry [[id number]][[id number]]  Delivery Method: Vaginal, Spontaneous Delivery (Filed from Delivery Summary)    Pateint had an uncomplicated postpartum course.  She is ambulating, tolerating a regular diet, passing flatus, and urinating well. Patient is discharged home in stable condition on 12/17/16.   Physical exam  Vitals:   12/16/16 0400 12/16/16 0859 12/16/16 1915 12/17/16 0612  BP: 105/63 (!) 107/55 114/65 112/64  Pulse: 81 78 84 73  Resp: 20 18 18 18   Temp: 98.3 F (36.8 C) 98.4 F (36.9 C) 98.4 F (36.9 C) 97.9 F (36.6 C)  TempSrc: Oral Oral Oral Oral  SpO2:  99%  100%  Weight:      Height:       General: alert Lochia: appropriate Uterine Fundus: firm Incision: Dressing is clean, dry, and intact DVT Evaluation: No evidence of DVT seen on physical exam. Labs: Lab Results  Component Value Date    WBC 6.5 12/14/2016   HGB 12.6 12/14/2016   HCT 39.0 12/14/2016   MCV 82.6 12/14/2016   PLT 196 12/14/2016   CMP Latest Ref Rng & Units 05/30/2016  Glucose 65 - 99 mg/dL 71    Discharge instruction: per After Visit Summary and "Baby and Me Booklet".  After Visit Meds:    Diet: routine diet  Activity: Advance as tolerated. Pelvic rest for 6 weeks.   Outpatient follow up:She has an appt in 4 weeks Follow up Appt:Future Appointments Date Time Provider Department Center  12/19/2016 9:45 AM [facility] US 2 [facility]  01/11/2017 8:30 AM Anyanwu, Jethro BastosUgonna A, MD [facility]   Follow up visit: No Follow-up on file.  Postpartum contraception: Tubal Ligation  Newborn Data: Live born female  Birth Weight: 6 lb 7.5 oz (2935 g) APGAR: 7, 9  Baby Feeding: Breast Disposition:home with mother   12/17/2016 Lisa BossierMyra C Colt Martelle, MD

## 2016-12-18 ENCOUNTER — Encounter (HOSPITAL_COMMUNITY): Payer: Self-pay | Admitting: Family Medicine

## 2016-12-19 ENCOUNTER — Encounter (HOSPITAL_COMMUNITY): Payer: Self-pay

## 2016-12-19 ENCOUNTER — Ambulatory Visit (HOSPITAL_COMMUNITY): Payer: BC Managed Care – PPO

## 2016-12-31 ENCOUNTER — Other Ambulatory Visit: Payer: Self-pay | Admitting: Obstetrics and Gynecology

## 2017-01-11 ENCOUNTER — Encounter: Payer: Self-pay | Admitting: Obstetrics & Gynecology

## 2017-01-11 ENCOUNTER — Ambulatory Visit (INDEPENDENT_AMBULATORY_CARE_PROVIDER_SITE_OTHER): Payer: BC Managed Care – PPO | Admitting: Obstetrics & Gynecology

## 2017-01-11 DIAGNOSIS — Z1389 Encounter for screening for other disorder: Secondary | ICD-10-CM | POA: Diagnosis not present

## 2017-01-11 NOTE — Progress Notes (Signed)
Post Partum Exam  Lisa Landry is a 41 y.o. 553P3003 female who presents for a postpartum visit. She is 4 weeks postpartum following a vaginal delivery. I have fully reviewed the prenatal and intrapartum course. The delivery was at 39 gestational weeks.  Anesthesia: none. Postpartum course has been uncomplicated. Baby's course has been uncomplicated. Baby is feeding by breast. Bleeding not at this time. Bowel function is normal. Bladder function is normal. Patient is sexually active. Contraception method is tubal ligation. Postpartum depression screening:neg  The following portions of the patient's history were reviewed and updated as appropriate: allergies, current medications, past family history, past medical history, past social history, past surgical history and problem list. Normal pap in 2017.  Review of Systems Pertinent items noted in HPI and remainder of comprehensive ROS otherwise negative.    Objective:  Blood pressure 117/76, pulse 67, weight 145 lb 9.6 oz (66 kg), currently breastfeeding.  General:  alert and no distress   Breasts:  inspection negative, no nipple discharge or bleeding, no masses or nodularity palpable  Lungs: clear to auscultation bilaterally  Heart:  regular rate and rhythm  Abdomen: soft, non-tender; bowel sounds normal; no masses,  no organomegaly.  BTL incision C/D/I  Pelvic:  not evaluated        Assessment:   Normal postpartum exam.  Plan:   1. Contraception: tubal ligation 2. No other concerns 3. Follow up as needed.    Jaynie CollinsUGONNA  Venie Montesinos, MD, FACOG Attending Obstetrician & Gynecologist, [facility], [facility]

## 2017-01-11 NOTE — Patient Instructions (Signed)
Return to clinic for any scheduled appointments or for any gynecologic concerns as needed.   

## 2017-02-05 ENCOUNTER — Encounter: Payer: Self-pay | Admitting: *Deleted

## 2017-09-28 ENCOUNTER — Emergency Department (HOSPITAL_COMMUNITY)
Admission: EM | Admit: 2017-09-28 | Discharge: 2017-09-28 | Disposition: A | Discharge status: Home / Self Care | Payer: BC Managed Care – PPO | Attending: Emergency Medicine | Admitting: Emergency Medicine

## 2017-09-28 ENCOUNTER — Other Ambulatory Visit: Payer: Self-pay

## 2017-09-28 DIAGNOSIS — H81391 Other peripheral vertigo, right ear: Secondary | ICD-10-CM

## 2017-09-28 DIAGNOSIS — Z79899 Other long term (current) drug therapy: Secondary | ICD-10-CM | POA: Diagnosis not present

## 2017-09-28 DIAGNOSIS — R42 Dizziness and giddiness: Secondary | ICD-10-CM | POA: Insufficient documentation

## 2017-09-28 LAB — CBC WITH DIFFERENTIAL/PLATELET
Abs Immature Granulocytes: 0 10*3/uL (ref 0.0–0.1)
Basophils Absolute: 0 10*3/uL (ref 0.0–0.1)
Basophils Relative: 0 %
EOS PCT: 2 %
Eosinophils Absolute: 0.1 10*3/uL (ref 0.0–0.7)
HEMATOCRIT: 42.6 % (ref 36.0–46.0)
Hemoglobin: 13 g/dL (ref 12.0–15.0)
Immature Granulocytes: 1 %
LYMPHS ABS: 1.5 10*3/uL (ref 0.7–4.0)
Lymphocytes Relative: 20 %
MCH: 25 pg — AB (ref 26.0–34.0)
MCHC: 30.5 g/dL (ref 30.0–36.0)
MCV: 81.9 fL (ref 78.0–100.0)
MONO ABS: 0.6 10*3/uL (ref 0.1–1.0)
MONOS PCT: 8 %
Neutro Abs: 5.2 10*3/uL (ref 1.7–7.7)
Neutrophils Relative %: 69 %
Platelets: 251 10*3/uL (ref 150–400)
RBC: 5.2 MIL/uL — ABNORMAL HIGH (ref 3.87–5.11)
RDW: 13.2 % (ref 11.5–15.5)
WBC: 7.4 10*3/uL (ref 4.0–10.5)

## 2017-09-28 LAB — BASIC METABOLIC PANEL
Anion gap: 11 (ref 5–15)
BUN: 10 mg/dL (ref 6–20)
CHLORIDE: 105 mmol/L (ref 101–111)
CO2: 22 mmol/L (ref 22–32)
CREATININE: 0.75 mg/dL (ref 0.44–1.00)
Calcium: 8.7 mg/dL — ABNORMAL LOW (ref 8.9–10.3)
GFR calc Af Amer: >60 mL/min (ref >60)
GFR calc non Af Amer: >60 mL/min (ref >60)
Glucose, Bld: 171 mg/dL — ABNORMAL HIGH (ref 65–99)
Potassium: 3.4 mmol/L — ABNORMAL LOW (ref 3.5–5.1)
Sodium: 138 mmol/L (ref 135–145)

## 2017-09-28 LAB — POC URINE PREG, ED: Preg Test, Ur: NEGATIVE

## 2017-09-28 MED ORDER — ONDANSETRON 4 MG PO TBDP
4.0000 mg | ORAL_TABLET | Freq: Once | ORAL | Status: AC
  Administered 2017-09-28: 4 mg via ORAL
  Filled 2017-09-28: qty 1

## 2017-09-28 MED ORDER — MECLIZINE HCL 25 MG PO TABS
25.0000 mg | ORAL_TABLET | Freq: Once | ORAL | Status: AC
  Administered 2017-09-28: 25 mg via ORAL
  Filled 2017-09-28: qty 1

## 2017-09-28 MED ORDER — MECLIZINE HCL 25 MG PO TABS: 25.0000 mg | ORAL_TABLET | Freq: Three times a day (TID) | ORAL | 0 refills | Status: DC | PRN

## 2017-09-28 NOTE — ED Triage Notes (Addendum)
Patient c/o dizziness accompanied by nausea and vomiting that began today. Patient was given  Zofran IV and has 18 ga IV in LAC.

## 2017-09-28 NOTE — ED Notes (Signed)
Patient ambulating in hall with tech.  Gait steady and even, endorses decrease in dizziness and improved comfort with movement

## 2017-09-28 NOTE — ED Notes (Signed)
Patient able to ambulate independently  

## 2017-09-28 NOTE — ED Notes (Signed)
Patient ambulated with this RN to restroom.  Episodes of dizziness with some nausea while ambulating, able to toilet independently.

## 2017-09-28 NOTE — ED Provider Notes (Signed)
[facility] EMERGENCY DEPARTMENT Provider Note  CSN: [id number] Arrival date & time: 09/28/17 9147  Chief Complaint(s) Dizziness  HPI Lisa Landry is a 42 y.o. female   The history is provided by the patient.  Dizziness  Quality:  Vertigo Severity:  Moderate Duration:  2 hours Chronicity:  New Context: bending over, head movement and standing up   Relieved by:  Being still Worsened by:  Movement and turning head Associated symptoms: nausea and vomiting (dry heaving)   Associated symptoms: no blood in stool, no chest pain, no diarrhea, no headaches, no palpitations, no tinnitus and no vision changes    Has had nasal congestion for 1-2 weeks. No fevers or cough.  Past Medical History Past Medical History:  Diagnosis Date  . Abnormal Pap smear   . Vaginal Pap smear, abnormal    There are no active problems to display for this patient.  Home Medication(s) Prior to Admission medications   Medication Sig Start Date End Date Taking? Authorizing Provider  acetaminophen (TYLENOL) 325 MG tablet Take 2 tablets (650 mg total) by mouth every 4 (four) hours as needed (for pain scale < 4). Patient not taking: Reported on 01/11/2017 12/16/16   Ignacia Marvel, MD  ferrous sulfate 325 (65 FE) MG tablet TAKE 1 TABLET (325 MG TOTAL) BY MOUTH 2 (TWO) TIMES DAILY. 12/31/16   Constant, Peggy, MD  ibuprofen (ADVIL,MOTRIN) 800 MG tablet Take 1 tablet (800 mg total) by mouth every 8 (eight) hours as needed. 12/16/16   Ignacia Marvel, MD  meclizine (ANTIVERT) 25 MG tablet Take 1 tablet (25 mg total) by mouth 3 (three) times daily as needed for dizziness. 09/28/17   Nira Conn, MD                                                                                                                                    Past Surgical History Past Surgical History:  Procedure Laterality Date  . COLPOSCOPY     abnormal pap.  . TUBAL LIGATION Bilateral 12/15/2016   Procedure:  POST PARTUM TUBAL LIGATION WITH FILSHIE CLIPS;  Surgeon: Reva Bores, MD;  Location: [facility];  Service: Gynecology;  Laterality: Bilateral;  . WISDOM TOOTH EXTRACTION     x2   Family History Family History  Problem Relation Age of Onset  . Hypothyroidism Mother   . Hypothyroidism Maternal Aunt     Social History Social History   Tobacco Use  . Smoking status: Never Smoker  . Smokeless tobacco: Never Used  Substance Use Topics  . Alcohol use: No  . Drug use: No   Allergies Amoxil [amoxicillin]  Review of Systems Review of Systems  HENT: Negative for tinnitus.   Cardiovascular: Negative for chest pain and palpitations.  Gastrointestinal: Positive for nausea and vomiting (dry heaving). Negative for blood in stool and diarrhea.  Neurological: Positive for dizziness. Negative for headaches.   All  other systems are reviewed and are negative for acute change except as noted in the HPI  Physical Exam Vital Signs  I have reviewed the triage vital signs BP 124/76   Pulse 78   Temp (!) 95 F (35 C) (Axillary)   Resp 17   Ht 5' (1.524 m)   Wt 62.1 kg (137 lb)   SpO2 100%   BMI 26.76 kg/m   Physical Exam  Constitutional: She is oriented to person, place, and time. She appears well-developed and well-nourished. No distress.  HENT:  Head: Normocephalic and atraumatic.  Right Ear: Tympanic membrane is not injected. A middle ear effusion is present.  Left Ear: Tympanic membrane is not injected.  No middle ear effusion.  Nose: Nose normal.  Eyes: Pupils are equal, round, and reactive to light. Conjunctivae and EOM are normal. Right eye exhibits no discharge. Left eye exhibits no discharge. No scleral icterus.  Neck: Normal range of motion. Neck supple.  Cardiovascular: Normal rate and regular rhythm. Exam reveals no gallop and no friction rub.  No murmur heard. Pulmonary/Chest: Effort normal and breath sounds normal. No stridor. No respiratory distress. She has no rales.    Abdominal: Soft. She exhibits no distension. There is no tenderness.  Musculoskeletal: She exhibits no edema or tenderness.  Neurological: She is alert and oriented to person, place, and time.  Mental Status:  Alert and oriented to person, place, and time.  Attention and concentration normal.  Speech clear.  Recent memory is intact  Cranial Nerves:  II Visual Fields: Intact to confrontation. Visual fields intact. III, IV, VI: Pupils equal and reactive to light and near. Full eye movement with nystagmus (Unilateral with fast beat to the right) V Facial Sensation: Normal. No weakness of masticatory muscles  VII: No facial weakness or asymmetry  VIII Auditory Acuity: Grossly normal  IX/X: The uvula is midline; the palate elevates symmetrically  XI: Normal sternocleidomastoid and trapezius strength  XII: The tongue is midline. No atrophy or fasciculations.   Motor System: Muscle Strength: 5/5 and symmetric in the upper and lower extremities. No pronation or drift.  Muscle Tone: Tone and muscle bulk are normal in the upper and lower extremities.   Reflexes: DTRs: 1+ and symmetrical in all four extremities. No Clonus Coordination: Intact finger-to-nose, heel-to-shin. No tremor.  Sensation: Intact to light touch, and pinprick.  Gait: Routine gait normal.  HINTS Plus: Nystagmus: Unilateral with fast beat to the right  Head impulse: abnormal Skew: normal Hearing: intact    Skin: Skin is warm and dry. No rash noted. She is not diaphoretic. No erythema.  Psychiatric: She has a normal mood and affect.  Vitals reviewed.   ED Results and Treatments Labs (all labs ordered are listed, but only abnormal results are displayed) Labs Reviewed  CBC WITH DIFFERENTIAL/PLATELET - Abnormal; Notable for the following components:      Result Value   RBC 5.20 (*)    MCH 25.0 (*)    All other components within normal limits  BASIC METABOLIC PANEL - Abnormal; Notable for the following components:    Potassium 3.4 (*)    Glucose, Bld 171 (*)    Calcium 8.7 (*)    All other components within normal limits  POC URINE PREG, ED  EKG  EKG Interpretation  Date/Time:    Ventricular Rate:    PR Interval:    QRS Duration:   QT Interval:    QTC Calculation:   R Axis:     Text Interpretation:        Radiology No results found. Pertinent labs & imaging results that were available during my care of the patient were reviewed by me and considered in my medical decision making (see chart for details).  Medications Ordered in ED Medications  meclizine (ANTIVERT) tablet 25 mg (25 mg Oral Given 09/28/17 0747)  ondansetron (ZOFRAN-ODT) disintegrating tablet 4 mg (4 mg Oral Given 09/28/17 0747)                                                                                                                                    Procedures Procedures  (including critical care time)  Medical Decision Making / ED Course I have reviewed the nursing notes for this encounter and the patient's prior records (if available in EHR or on provided paperwork).    Patient here with vertiginous symptoms exacerbated with movement.  Reassuring HINTS exam.  No focal deficits on exam.  UPT negative.  Labs without significant anemia or electrolyte derangements.  Patient provided with meclizine resulting in significant improvement in her symptomatology.  Reassuring for peripheral ideology.  Doubt CVA.  The patient appears reasonably screened and/or stabilized for discharge and I doubt any other medical condition or other [facility] requiring further screening, evaluation, or treatment in the ED at this time prior to discharge.  The patient is safe for discharge with strict return precautions.   Final Clinical Impression(s) / ED Diagnoses Final diagnoses:  Peripheral vertigo involving right ear    Disposition: Discharge  Condition: Good  I have discussed the results, Dx and Tx plan with the patient who expressed understanding and agree(s) with the plan. Discharge instructions discussed at great length. The patient was given strict return precautions who verbalized understanding of the instructions. No further questions at time of discharge.    ED Discharge Orders        Ordered    meclizine (ANTIVERT) 25 MG tablet  3 times daily PRN     09/28/17 0932       Follow Up: [facility] [address] [place] [address] [phone]  Schedule an appointment as soon as possible for a visit  As needed      This chart was dictated using voice recognition software.  Despite best efforts to proofread,  errors can occur which can change the documentation meaning.   Nira Conn, MD 09/28/17 [phone]

## 2017-09-28 NOTE — ED Notes (Signed)
Ambulating patient and she states her dizziness has improved but has not went away completely. But she feels much better.

## 2020-08-26 ENCOUNTER — Other Ambulatory Visit (HOSPITAL_BASED_OUTPATIENT_CLINIC_OR_DEPARTMENT_OTHER): Payer: Self-pay

## 2021-01-17 ENCOUNTER — Other Ambulatory Visit (HOSPITAL_COMMUNITY)
Admission: RE | Admit: 2021-01-17 | Discharge: 2021-01-17 | Disposition: A | Discharge status: Home / Self Care | Payer: BC Managed Care – PPO | Source: Ambulatory Visit | Attending: Obstetrics & Gynecology | Admitting: Obstetrics & Gynecology

## 2021-01-17 ENCOUNTER — Other Ambulatory Visit: Payer: Self-pay

## 2021-01-17 ENCOUNTER — Ambulatory Visit (INDEPENDENT_AMBULATORY_CARE_PROVIDER_SITE_OTHER): Payer: BC Managed Care – PPO | Admitting: Obstetrics & Gynecology

## 2021-01-17 ENCOUNTER — Encounter: Payer: Self-pay | Admitting: Obstetrics & Gynecology

## 2021-01-17 VITALS — BP 135/82 | HR 81 | Ht 60.0 in | Wt 134.2 lb

## 2021-01-17 DIAGNOSIS — Z124 Encounter for screening for malignant neoplasm of cervix: Secondary | ICD-10-CM | POA: Diagnosis present

## 2021-01-17 DIAGNOSIS — Z1231 Encounter for screening mammogram for malignant neoplasm of breast: Secondary | ICD-10-CM

## 2021-01-17 DIAGNOSIS — N949 Unspecified condition associated with female genital organs and menstrual cycle: Secondary | ICD-10-CM

## 2021-01-17 NOTE — Patient Instructions (Signed)
Return to clinic for any scheduled appointments or for any gynecologic concerns as needed.    Genital Warts Genital warts are a common STI (sexually transmitted infection). They may appear as small bumps on the skin of the genital and anal areas. They sometimes become irritated and cause pain. Genital warts are easily passed to other people through sexual contact. Many people do not know that they are infected, and they may be infected for years without symptoms. Even without symptoms, they can pass the infection to their sexual partners. What are the causes? This condition is caused by a virus that is called human papillomavirus (HPV). HPV is spread by having unprotected sex with an infected person. It can be spread through vaginal, anal, and oral sex. What increases the risk? You are more likely to develop this condition if: You have unprotected sex. You have multiple sexual partners. You are sexually active before age 28. You are a man who is not circumcised. You have a female sexual partner who is not circumcised. You have a weakened body defense system (immune system) due to disease or medicine. What are the signs or symptoms? Symptoms of this condition include: Small growths in the genital area or anal area. These warts often grow in clusters. Itching and irritation in the genital area or anal area. Bleeding from the warts. Pain during sex. How is this diagnosed? This condition is diagnosed based on your symptoms and a physical exam. You may also have other tests, including: Biopsy. A tissue sample is removed so it can be checked under a microscope. Colposcopy. In females, a magnifying tool is used to examine the vagina and cervix. Certain solutions may be used to make the HPV cells change color so they can be seen more easily. A Pap test in females. Tests for other STIs. How is this treated? This condition may be treated with: Medicines, such as solutions or creams that are applied  to your skin (topical). Procedures, such as: Freezing the warts with liquid nitrogen (cryotherapy). Burning the warts with a laser or electric probe (electrocautery). Surgery to remove the warts. Getting treatment is important because genital warts can lead to other problems. In females, the virus that causes genital warts may increase the risk for cervical cancer. Follow these instructions at home: Medicines  Apply over-the-counter and prescription medicines only as told by your health care provider. Do not treat genital warts with medicines that are used for treating hand warts. Talk with your health care provider about using over-the-counter anti-itch creams. Instructions for women Get screened regularly for cervical cancer. This type of cancer is slow growing and can almost always be cured if it is found early. If you become pregnant, tell your health care provider that you have had an HPV infection. Your health care provider will monitor you closely during pregnancy. General instructions Do not touch or scratch the warts. Do not have sex until your treatment has been completed. Tell your current and past sexual partners about your condition because they may also need treatment. After treatment, use condoms during sex to prevent future infections. Keep all follow-up visits as told by your health care provider. This is important. How is this prevented? Talk with your health care provider about getting the HPV vaccine. The vaccine: Can prevent some HPV infections and cancers. Is recommended for males and females who are 24-64 years old. Is not recommended for pregnant women. Will not work if you already have HPV. Contact a health care provider if  you: Have redness, swelling, or pain in the area of the treated skin. Have a fever. Feel generally ill. Feel lumps in and around your genital or anal area. Have bleeding in your genital or anal area. Have pain during sex or bleeding after  sex. Summary Genital warts are a common STI (sexually transmitted infection). It may appear as small bumps on the genital and anal areas. This condition is caused by a virus that is called human papillomavirus (HPV). HPV is spread by having unprotected sex with an infected person. It can be spread through vaginal, anal, and oral sex. Treatment is important because genital warts can lead to other problems. In females, the virus that causes genital warts may increase the risk for cervical cancer. This condition may be treated with medicine that is applied to the skin or procedures to remove the warts. The HPV vaccine can prevent some HPV infections and cancers. It is recommended that the vaccine be given to males and females who are 10-42 years old. This information is not intended to replace advice given to you by your health care provider. Make sure you discuss any questions you have with your health care provider. Document Revised: 03/05/2019 Document Reviewed: 03/05/2019 Elsevier Patient Education  2022 [employer].

## 2021-01-17 NOTE — Progress Notes (Signed)
   GYNECOLOGY OFFICE VISIT NOTE  History:   Lisa Landry is a 45 y.o. [phone] here today for evaluation of lesion noted outside her vagina.  It does not hurt, no drainage. Had a similar lesion about 20 years ago, reports it was either lasered off or frozen off. She denies any abnormal vaginal discharge, bleeding, pelvic pain or other concerns.    Past Medical History:  Diagnosis Date   Abnormal Pap smear     Past Surgical History:  Procedure Laterality Date   COLPOSCOPY     abnormal pap.   TUBAL LIGATION Bilateral 12/15/2016   Procedure: POST PARTUM TUBAL LIGATION WITH FILSHIE CLIPS;  Surgeon: Reva Bores, MD;  Location: [facility];  Service: Gynecology;  Laterality: Bilateral;   WISDOM TOOTH EXTRACTION     x2    The following portions of the patient's history were reviewed and updated as appropriate: allergies, current medications, past family history, past medical history, past social history, past surgical history and problem list.   Health Maintenance:  Normal pap and negative HRHPV on 07/09/2011.  Never had a mammogram   Review of Systems:  Pertinent items noted in HPI and remainder of comprehensive ROS otherwise negative.  Physical Exam:  BP 135/82   Pulse 81   Ht 5' (1.524 m)   Wt 134 lb 3.2 oz (60.9 kg)   BMI 26.21 kg/m  CONSTITUTIONAL: Well-developed, well-nourished female in no acute distress.  HEENT:  Normocephalic, atraumatic. External right and left ear normal. No scleral icterus.  NECK: Normal range of motion, supple, no masses noted on observation SKIN: No rash noted. Not diaphoretic. No erythema. No pallor. MUSCULOSKELETAL: Normal range of motion. No edema noted. NEUROLOGIC: Alert and oriented to person, place, and time. Normal muscle tone coordination. No cranial nerve deficit noted. PSYCHIATRIC: Normal mood and affect. Normal behavior. Normal judgment and thought content. CARDIOVASCULAR: Normal heart rate noted RESPIRATORY: Effort and breath sounds normal,  no problems with respiration noted ABDOMEN: No masses noted. No other overt distention noted.   PELVIC: See findings below. Otherwise, normal appearing external genitalia and urethral meatus; normal appearing vaginal mucosa and cervix.  No abnormal discharge noted.  Pap smear obtained.  Normal uterine size, no other palpable masses, no uterine or adnexal tenderness.  Done in presence of chaperone. 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      Assessment and Plan:    1. 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  Discussed treatment/evaluation options: excision, cryotherapy, Aldara cream. May also need biopsy to confirm diagnosis. She desires cryotherapy, she was referred to our other office at [facility] for further evaluation and management as they have the cryotherapy apparatus.  2. Breast cancer screening by mammogram - MM 3D SCREEN BREAST BILATERAL; Future scheduled  3. Pap smear for cervical cancer screening - Cytology - PAP done, will follow up results and manage accordingly. Routine preventative health maintenance measures emphasized. Please refer to After Visit Summary for other counseling recommendations.   Return for cryotherapy evaluation at [facility].    I spent 22 minutes dedicated to the care of this patient including pre-visit review of records, face to face time with the patient discussing her conditions and treatments and post visit orders.    Jaynie Collins, MD, FACOG Obstetrician & Gynecologist, [facility] for [employer], [facility]

## 2021-01-24 LAB — CYTOLOGY - PAP
Comment: NEGATIVE
Diagnosis: UNDETERMINED — AB
High risk HPV: NEGATIVE

## 2021-01-26 ENCOUNTER — Encounter: Payer: BC Managed Care – PPO | Admitting: Obstetrics and Gynecology

## 2021-01-31 NOTE — Progress Notes (Signed)
    GYNECOLOGY CLINIC PROCEDURE NOTE  Cryotherapy details  Indication: Genital wart  The indications for cryotherapy were reviewed with the patient in detail. She was counseled about that efficacy of this procedure, and possible need for excisional procedure in the future if her cervical dysplasia persists.  The risks of the procedure where explained in detail and patient was told to expect a copious amount of discharge in the next few weeks. All her questions were answered, and written informed consent was obtained.  The patient was placed in the dorsal lithotomy position. Her lesion/genital wart was visualized - due to the broad base, I did numb her with 1 cc of 1% lidocaine with epinephrine. The appropriate cryotherapy probe was picked and affixed to cryotherapy apparatus. Then nitrogen gas was then activated, the probe was coated with lubricating jelly and applied to the wart. This was kept in place until 1-2 mm area around the wart was also noted to be frozen. A thawing period was observed.  A second cycle of cryotherapy was then administered to the lesion for about 1 minute. This was a repeated a third time since the total lesion wasn't encompassed twice by the first 2 rounds. On the third round, it did freeze a circumferential area of 1-2 mm beyond the lesion that was frozen.  The patient tolerated the procedure well without any complications. Routine post procedure instructions were given to the patient. Will repeat in a month if persistent.   Milas Hock, MD, FACOG Obstetrician & Gynecologist, [facility] for [facility], [facility]

## 2021-02-02 ENCOUNTER — Other Ambulatory Visit: Payer: Self-pay

## 2021-02-02 ENCOUNTER — Encounter: Payer: Self-pay | Admitting: Obstetrics and Gynecology

## 2021-02-02 ENCOUNTER — Ambulatory Visit (INDEPENDENT_AMBULATORY_CARE_PROVIDER_SITE_OTHER): Payer: BC Managed Care – PPO | Admitting: Obstetrics and Gynecology

## 2021-02-02 VITALS — BP 116/67 | HR 92 | Ht 60.0 in | Wt 132.0 lb

## 2021-02-02 DIAGNOSIS — A630 Anogenital (venereal) warts: Secondary | ICD-10-CM

## 2021-02-22 ENCOUNTER — Other Ambulatory Visit: Payer: Self-pay

## 2021-02-22 ENCOUNTER — Ambulatory Visit
Admission: RE | Admit: 2021-02-22 | Discharge: 2021-02-22 | Disposition: A | Discharge status: Home / Self Care | Payer: BC Managed Care – PPO | Source: Ambulatory Visit | Attending: Obstetrics & Gynecology | Admitting: Obstetrics & Gynecology

## 2021-02-22 DIAGNOSIS — Z1231 Encounter for screening mammogram for malignant neoplasm of breast: Secondary | ICD-10-CM

## 2021-12-07 IMAGING — MG MM DIGITAL SCREENING BILAT W/ TOMO AND CAD
8 series · 9 of 24 positions shown · non-contrast
Comparison: None.

CLINICAL DATA: Screening.

EXAM:
DIGITAL SCREENING BILATERAL MAMMOGRAM WITH TOMOSYNTHESIS AND CAD
TECHNIQUE: Bilateral screening digital craniocaudal and mediolateral oblique
mammograms were obtained. Bilateral screening digital breast
tomosynthesis was performed. The images were evaluated with
computer-aided detection.

[R MLO synth-2D]
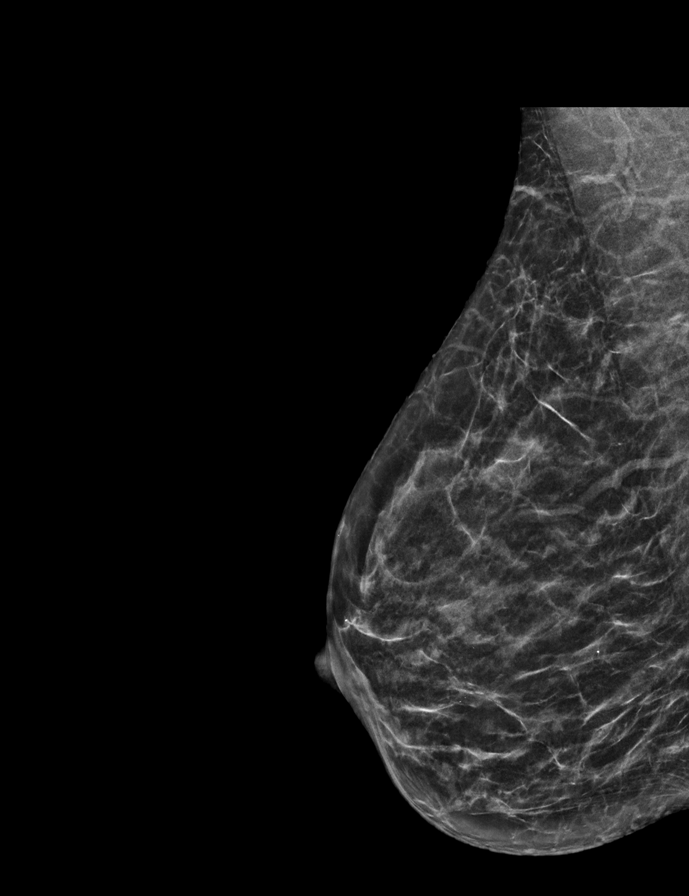

[R CC synth-2D]
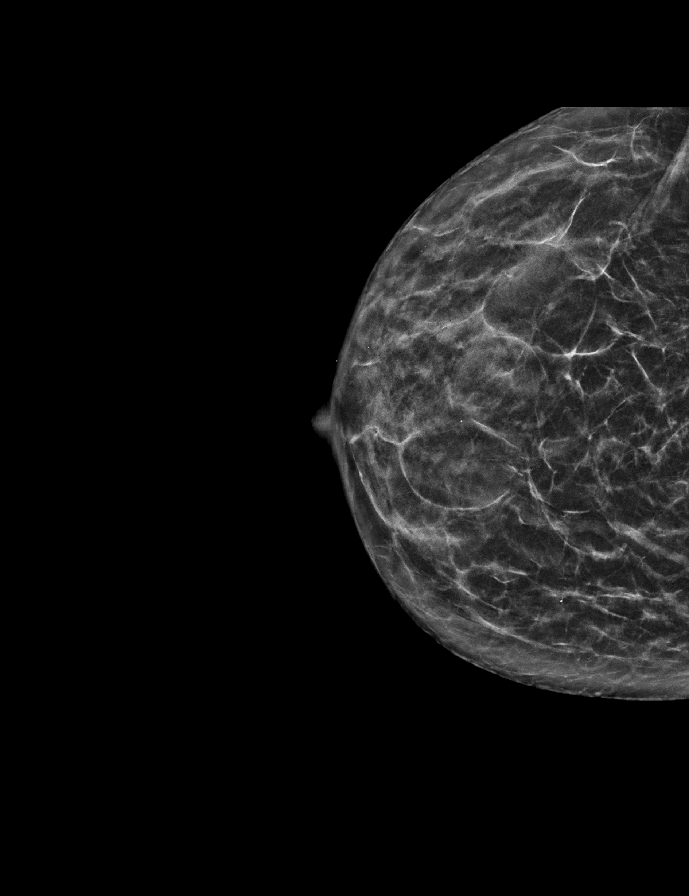

[L CC synth-2D]
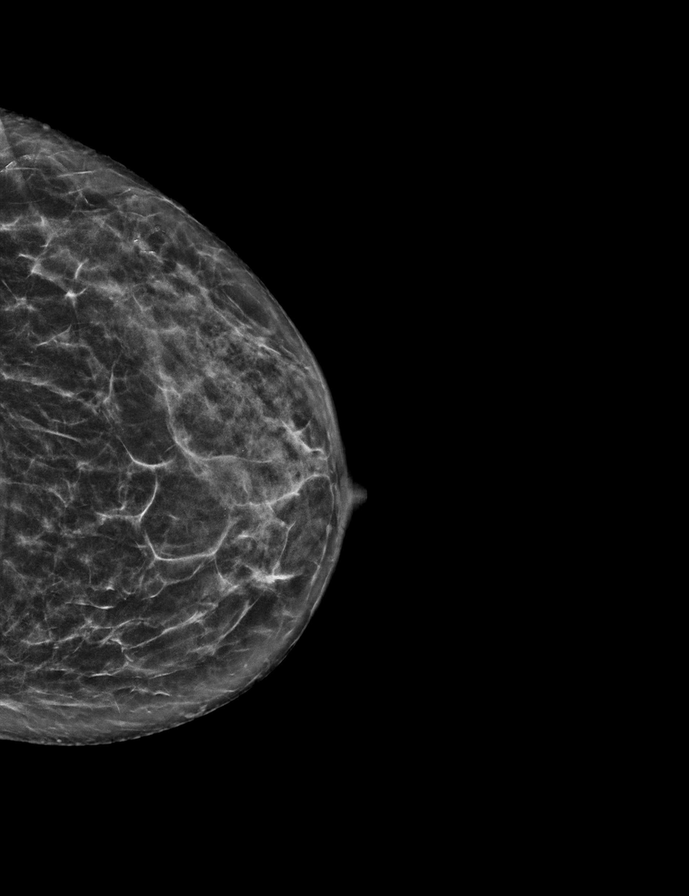

[L MLO synth-2D]
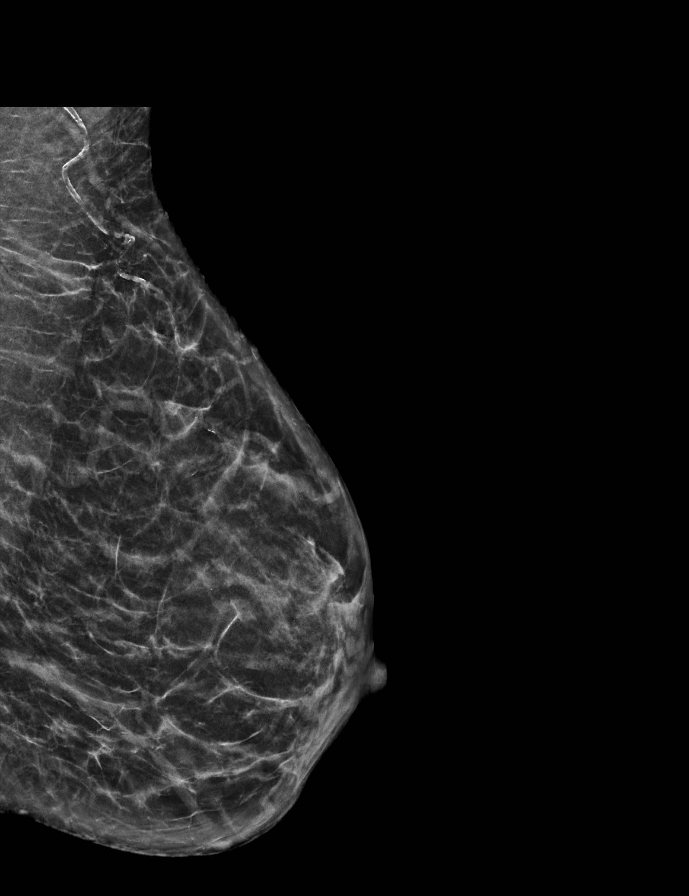

[R MLO tomo · 2 of 47 frames shown]
[frame 16/47]
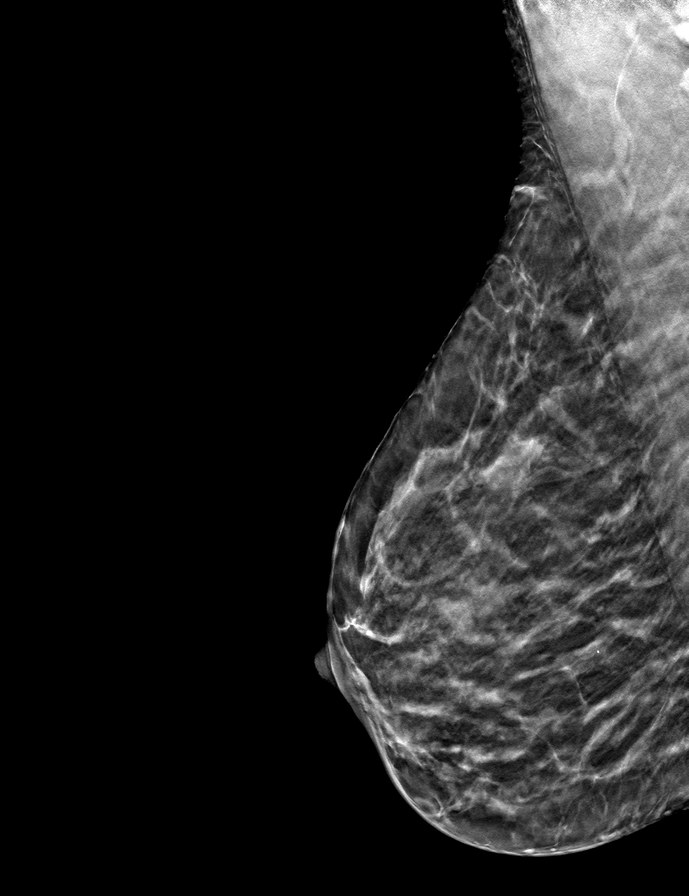
[frame 24/47]
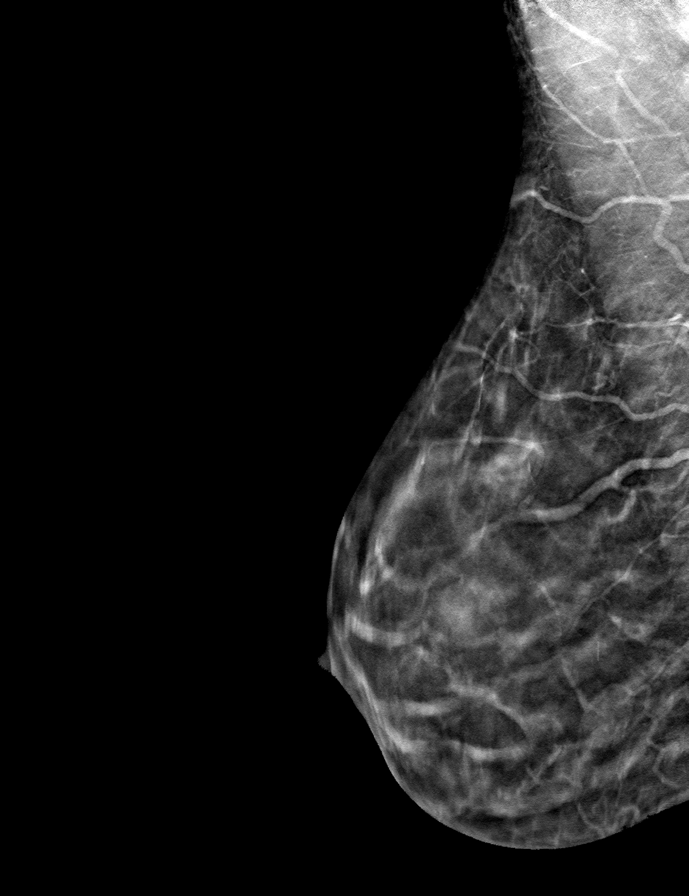

[R CC tomo · tomo slice 26/51.0]
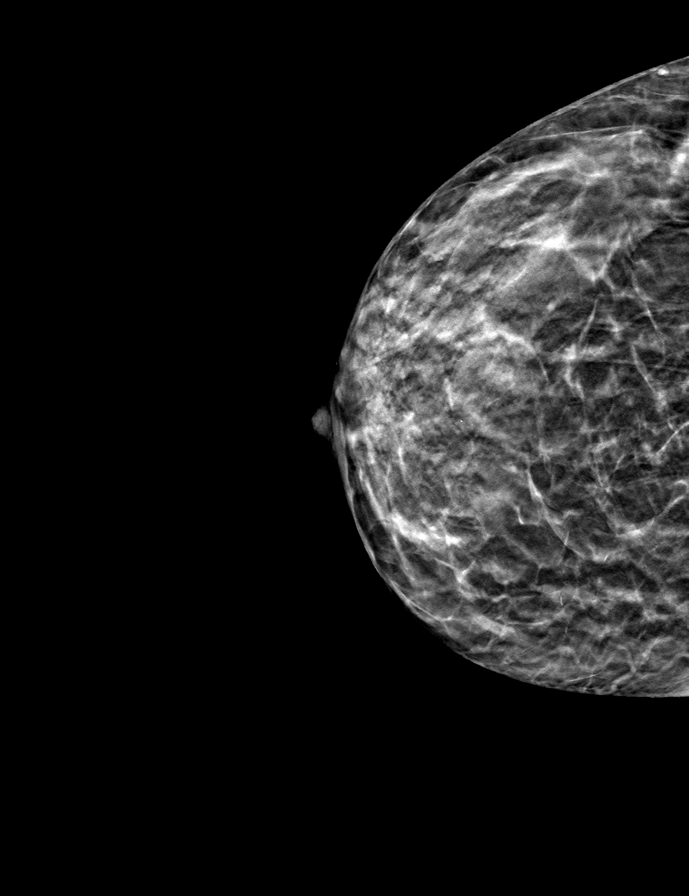

[L MLO tomo · tomo slice 25/48.0]
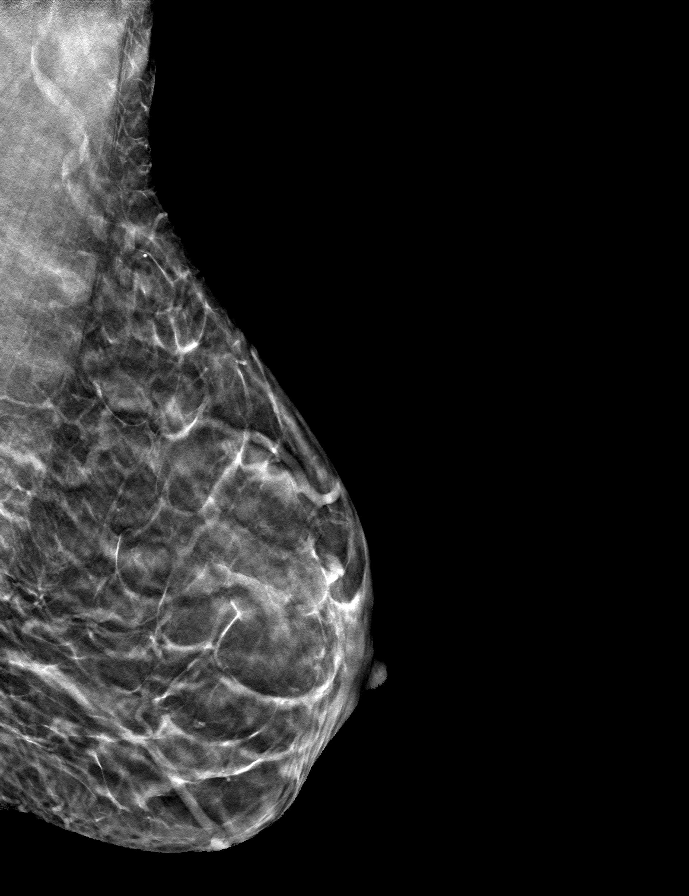

[L CC tomo · tomo slice 25/50.0]
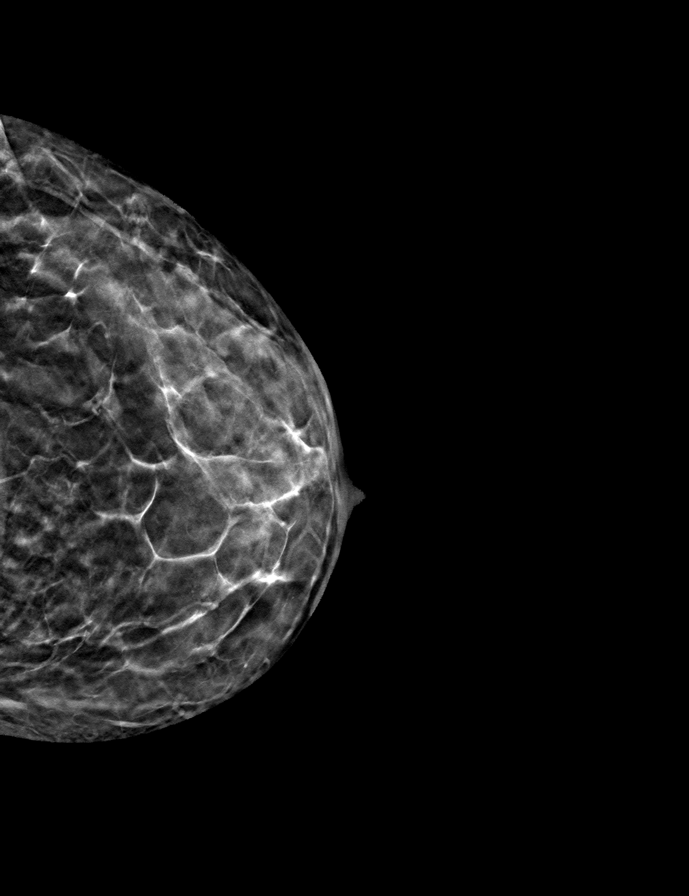

[9 of 24 positions shown; findings below may reference images not displayed]

ACR Breast Density Category c: The breast tissue is heterogeneously
dense, which may obscure small masses
FINDINGS: There are no findings suspicious for malignancy.
IMPRESSION: No mammographic evidence of malignancy. A result letter of this
screening mammogram will be mailed directly to the patient.

RECOMMENDATION:
Screening mammogram in one year. (Code:C8-T-HNK)

BI-RADS CATEGORY  1: Negative.

## 2022-08-07 ENCOUNTER — Other Ambulatory Visit (HOSPITAL_COMMUNITY)
Admission: RE | Admit: 2022-08-07 | Discharge: 2022-08-07 | Disposition: A | Discharge status: Home / Self Care | Payer: BC Managed Care – PPO | Source: Ambulatory Visit | Attending: Obstetrics & Gynecology | Admitting: Obstetrics & Gynecology

## 2022-08-07 ENCOUNTER — Ambulatory Visit (INDEPENDENT_AMBULATORY_CARE_PROVIDER_SITE_OTHER): Payer: BC Managed Care – PPO

## 2022-08-07 VITALS — BP 114/75 | HR 81

## 2022-08-07 DIAGNOSIS — N898 Other specified noninflammatory disorders of vagina: Secondary | ICD-10-CM | POA: Insufficient documentation

## 2022-08-07 DIAGNOSIS — R350 Frequency of micturition: Secondary | ICD-10-CM | POA: Diagnosis not present

## 2022-08-07 DIAGNOSIS — A5901 Trichomonal vulvovaginitis: Secondary | ICD-10-CM

## 2022-08-07 DIAGNOSIS — N760 Acute vaginitis: Secondary | ICD-10-CM

## 2022-08-07 HISTORY — DX: Trichomonal vulvovaginitis: A59.01

## 2022-08-07 LAB — POCT URINALYSIS DIPSTICK
Bilirubin, UA: NEGATIVE
Blood, UA: NEGATIVE
Glucose, UA: NEGATIVE
Ketones, UA: NEGATIVE
Nitrite, UA: NEGATIVE
Protein, UA: POSITIVE — AB
Spec Grav, UA: 1.025 (ref 1.010–1.025)
Urobilinogen, UA: 1 E.U./dL
pH, UA: 6.5 (ref 5.0–8.0)

## 2022-08-07 NOTE — Progress Notes (Signed)
SUBJECTIVE:  47 y.o. female complains of vaginal discharge for 5 day(s) and urinary urgency Denies abnormal vaginal bleeding or significant pelvic pain or fever.Denies history of known exposure to STD.  No LMP recorded.  OBJECTIVE:  She appears alert, well appearing, in no apparent distress Urine dipstick: positive for WBC's.  ASSESSMENT:  Vaginal Discharge  Urgency STD Screening   PLAN:  GC, chlamydia, trichomonas, BVAG, CVAG probe and urine culture sent to lab. Treatment: To be determined once lab results are received ROV prn if symptoms persist or worsen.

## 2022-08-09 ENCOUNTER — Encounter: Payer: Self-pay | Admitting: Obstetrics & Gynecology

## 2022-08-09 LAB — CERVICOVAGINAL ANCILLARY ONLY
Bacterial Vaginitis (gardnerella): POSITIVE — AB
Candida Glabrata: NEGATIVE
Candida Vaginitis: NEGATIVE
Chlamydia: NEGATIVE
Comment: NEGATIVE
Comment: NEGATIVE
Comment: NEGATIVE
Comment: NEGATIVE
Comment: NEGATIVE
Comment: NORMAL
Neisseria Gonorrhea: NEGATIVE
Trichomonas: POSITIVE — AB

## 2022-08-09 LAB — URINE CULTURE

## 2022-08-09 MED ORDER — METRONIDAZOLE 500 MG PO TABS: 500.0000 mg | ORAL_TABLET | Freq: Two times a day (BID) | ORAL | 0 refills | Status: DC

## 2022-08-09 NOTE — Addendum Note (Signed)
Addended by: Verita Schneiders A on: 08/09/2022 05:03 PM   Modules accepted: Orders

## 2022-08-09 NOTE — Progress Notes (Signed)
Patient has trichomonal vaginitis.  Recommend testing for other STIs, also needs to let partner(s) know so the partner(s) can get testing and treatment. Patient and sex partner(s) should abstain from unprotected sexual activity for seven days after everyone receives appropriate treatment.  Metronidazole was prescribed for patient, this will also treat her bacterial vaginitis.  Patient will need to return in about 4 weeks after treatment for repeat test of cure.  Please call to inform patient of results and recommendations, and advise to pick up prescription and take as directed.  Please advise patient to practice safe sex at all times.   Verita Schneiders, MD

## 2022-08-10 ENCOUNTER — Other Ambulatory Visit: Payer: Self-pay | Admitting: Obstetrics & Gynecology

## 2022-08-10 DIAGNOSIS — A5901 Trichomonal vulvovaginitis: Secondary | ICD-10-CM

## 2022-08-10 DIAGNOSIS — B9689 Other specified bacterial agents as the cause of diseases classified elsewhere: Secondary | ICD-10-CM

## 2022-08-13 MED ORDER — METRONIDAZOLE 500 MG PO TABS: 500.0000 mg | ORAL_TABLET | Freq: Two times a day (BID) | ORAL | 0 refills | Status: AC

## 2022-09-17 ENCOUNTER — Ambulatory Visit (INDEPENDENT_AMBULATORY_CARE_PROVIDER_SITE_OTHER): Payer: BC Managed Care – PPO | Admitting: *Deleted

## 2022-09-17 ENCOUNTER — Other Ambulatory Visit (HOSPITAL_COMMUNITY)
Admission: RE | Admit: 2022-09-17 | Discharge: 2022-09-17 | Disposition: A | Discharge status: Home / Self Care | Payer: BC Managed Care – PPO | Source: Ambulatory Visit | Attending: Obstetrics & Gynecology | Admitting: Obstetrics & Gynecology

## 2022-09-17 DIAGNOSIS — A5901 Trichomonal vulvovaginitis: Secondary | ICD-10-CM | POA: Insufficient documentation

## 2022-09-17 NOTE — Progress Notes (Signed)
SUBJECTIVE:  47 y.o. female who is here for a TOC.   Denies abnormal vaginal discharge, bleeding or significant pelvic pain. No UTI symptoms. Denies history of known exposure to STD.  No LMP recorded.  OBJECTIVE:  She appears well.   ASSESSMENT:  Test of Cure HX of Trichomonas   PLAN:   GC, chlamydia, and trichomonas probe sent to lab.   Pt follow up as needed.   Scheryl Marten, RN

## 2022-09-19 LAB — CERVICOVAGINAL ANCILLARY ONLY
Chlamydia: NEGATIVE
Comment: NEGATIVE
Comment: NEGATIVE
Comment: NORMAL
Neisseria Gonorrhea: NEGATIVE
Trichomonas: POSITIVE — AB

## 2022-09-21 ENCOUNTER — Other Ambulatory Visit: Payer: Self-pay | Admitting: *Deleted

## 2022-09-21 ENCOUNTER — Telehealth: Payer: Self-pay | Admitting: *Deleted

## 2022-09-21 MED ORDER — METRONIDAZOLE 50 MG/ML ORAL SUSPENSION: 500.0000 mg | Freq: Two times a day (BID) | ORAL | 0 refills | Status: DC

## 2022-09-21 NOTE — Telephone Encounter (Signed)
-----   Message from Tereso Newcomer, MD sent at 09/20/2022 11:32 AM EDT ----- Patient still has trichomonas.  Please retreat as per protocol and ensure her partner(s) are being treated.  Jaynie Collins, MD

## 2022-09-21 NOTE — Telephone Encounter (Signed)
Pt aware of results and requested oral suspension instead of pills

## 2022-11-29 ENCOUNTER — Ambulatory Visit (INDEPENDENT_AMBULATORY_CARE_PROVIDER_SITE_OTHER): Payer: BC Managed Care – PPO | Admitting: Obstetrics & Gynecology

## 2022-11-29 ENCOUNTER — Other Ambulatory Visit (HOSPITAL_COMMUNITY)
Admission: RE | Admit: 2022-11-29 | Discharge: 2022-11-29 | Disposition: A | Discharge status: Home / Self Care | Payer: BC Managed Care – PPO | Source: Ambulatory Visit | Attending: Obstetrics & Gynecology | Admitting: Obstetrics & Gynecology

## 2022-11-29 ENCOUNTER — Encounter: Payer: Self-pay | Admitting: Obstetrics & Gynecology

## 2022-11-29 VITALS — BP 106/70 | HR 74 | Ht 60.0 in | Wt 145.0 lb

## 2022-11-29 DIAGNOSIS — Z01419 Encounter for gynecological examination (general) (routine) without abnormal findings: Secondary | ICD-10-CM

## 2022-11-29 DIAGNOSIS — Z1211 Encounter for screening for malignant neoplasm of colon: Secondary | ICD-10-CM

## 2022-11-29 DIAGNOSIS — Z1231 Encounter for screening mammogram for malignant neoplasm of breast: Secondary | ICD-10-CM

## 2022-11-29 DIAGNOSIS — A599 Trichomoniasis, unspecified: Secondary | ICD-10-CM | POA: Insufficient documentation

## 2022-11-29 DIAGNOSIS — Z1339 Encounter for screening examination for other mental health and behavioral disorders: Secondary | ICD-10-CM

## 2022-11-29 NOTE — Progress Notes (Signed)
GYNECOLOGY ANNUAL PREVENTATIVE CARE ENCOUNTER NOTE  History:     Analese Sovine is a 47 y.o. G65P3003 female here for a routine annual gynecologic exam.  Current complaints: wants test of cure for recent Trichomonal vaginitis infection.   Denies abnormal vaginal bleeding, discharge, pelvic pain, problems with intercourse or other gynecologic concerns.    Gynecologic History No LMP recorded. (Menstrual status: Irregular Periods). Contraception: tubal ligation Last Pap: 01/17/2021. Result was ASCUS with negative HPV Last Mammogram: 02/22/2021.  Result was normal  Obstetric History OB History  Gravida Para Term Preterm AB Living  3 3 3  0 0 3  SAB IAB Ectopic Multiple Live Births  0 0 0 0 3    # Outcome Date GA Lbr Len/2nd Weight Sex Type Anes PTL Lv  3 Term 12/15/16 [redacted]w[redacted]d 01:20 / 00:07 6 lb 7.5 oz (2.935 kg) M Vag-Spont None  LIV  2 Term 02/01/12 [redacted]w[redacted]d 00:44 / 00:06 6 lb 14.1 oz (3.12 kg) F Vag-Spont None  LIV     Birth Comments: 47 year old 2nd pregnancy 32 year old sister  No problems during pregnancy other than vitamins Non smoker No alcohol  Spontaneous ROM--12 hours before delivery. Vaginal delivery Home after 1 day   1 Term 1998 [redacted]w[redacted]d   F Vag-Spont None  LIV    Past Medical History:  Diagnosis Date   Abnormal Pap smear    Trichomonal vaginitis 08/07/2022    Past Surgical History:  Procedure Laterality Date   COLPOSCOPY     abnormal pap.   TUBAL LIGATION Bilateral 12/15/2016   Procedure: POST PARTUM TUBAL LIGATION WITH FILSHIE CLIPS;  Surgeon: Reva Bores, MD;  Location: [facility];  Service: Gynecology;  Laterality: Bilateral;   WISDOM TOOTH EXTRACTION     x2    No current outpatient medications on file prior to visit.   No current facility-administered medications on file prior to visit.    Allergies  Allergen Reactions   Amoxil [Amoxicillin] Hives    Has patient had a PCN reaction causing immediate rash, facial/tongue/throat swelling, SOB or  lightheadedness with hypotension: Unknown Has patient had a PCN reaction causing severe rash involving mucus membranes or skin necrosis: No Has patient had a PCN reaction that required hospitalization: No Has patient had a PCN reaction occurring within the last 10 years: Yes If all of the above answers are "NO", then may proceed with Cephalosporin use.      Social History:  reports that she has never smoked. She has never used smokeless tobacco. She reports that she does not drink alcohol and does not use drugs.  Family History  Problem Relation Age of Onset   Hypothyroidism Mother    Hypothyroidism Maternal Aunt    Breast cancer Neg Hx     The following portions of the patient's history were reviewed and updated as appropriate: allergies, current medications, past family history, past medical history, past social history, past surgical history and problem list.  Review of Systems Pertinent items noted in HPI and remainder of comprehensive ROS otherwise negative.  Physical Exam:  BP 106/70   Pulse 74   Ht 5' (1.524 m)   Wt 145 lb (65.8 kg)   BMI 28.32 kg/m  CONSTITUTIONAL: Well-developed, well-nourished female in no acute distress.  HENT:  Normocephalic, atraumatic, External right and left ear normal.  EYES: Conjunctivae and EOM are normal. Pupils are equal, round, and reactive to light. No scleral icterus.  NECK: Normal range of motion, supple, no masses.  Normal thyroid.  SKIN: Skin is warm and dry. No rash noted. Not diaphoretic. No erythema. No pallor. MUSCULOSKELETAL: Normal range of motion. No tenderness.  No cyanosis, clubbing, or edema. NEUROLOGIC: Alert and oriented to person, place, and time. Normal reflexes, muscle tone coordination.  PSYCHIATRIC: Normal mood and affect. Normal behavior. Normal judgment and thought content. CARDIOVASCULAR: Normal heart rate noted, regular rhythm RESPIRATORY: Clear to auscultation bilaterally. Effort and breath sounds normal, no  problems with respiration noted. BREASTS: Symmetric in size. No masses, tenderness, skin changes, nipple drainage, or lymphadenopathy bilaterally. Performed in the presence of a chaperone. ABDOMEN: Soft, no distention noted.  No tenderness, rebound or guarding.  PELVIC: Deferred by patient.   Assessment and Plan:    1. Infection due to trichomonas Self-swab done, will follow up results and manage accordingly. - Cervicovaginal ancillary only( [place])  2. Breast cancer screening by mammogram Normal breast examination today, she was advised to perform periodic self breast examinations.  Mammogram scheduled for breast cancer screening. - MM 3D SCREENING MAMMOGRAM BILATERAL BREAST; Future  3. Colon cancer screening Discussed need for colon cancer screening over the age 31. Colonoscopy recommended as this is the gold standard. Referral made to Gastroenterology for colonoscopy - Ambulatory referral to Gastroenterology  4. Well woman exam with routine gynecological exam Up to date on pap, due in 2025.  Routine preventative health maintenance measures emphasized. Please refer to After Visit Summary for other counseling recommendations.      Jaynie Collins, MD, FACOG Obstetrician & Gynecologist, [facility] for [employer], [facility]

## 2022-12-11 ENCOUNTER — Ambulatory Visit
Admission: RE | Admit: 2022-12-11 | Discharge: 2022-12-11 | Disposition: A | Discharge status: Home / Self Care | Payer: BC Managed Care – PPO | Source: Ambulatory Visit | Attending: Obstetrics & Gynecology | Admitting: Obstetrics & Gynecology

## 2022-12-11 DIAGNOSIS — Z1231 Encounter for screening mammogram for malignant neoplasm of breast: Secondary | ICD-10-CM

## 2022-12-13 ENCOUNTER — Other Ambulatory Visit: Payer: Self-pay | Admitting: *Deleted

## 2022-12-13 DIAGNOSIS — N6331 Unspecified lump in axillary tail of the right breast: Secondary | ICD-10-CM

## 2022-12-14 ENCOUNTER — Other Ambulatory Visit: Payer: Self-pay | Admitting: Obstetrics & Gynecology

## 2022-12-14 DIAGNOSIS — R928 Other abnormal and inconclusive findings on diagnostic imaging of breast: Secondary | ICD-10-CM

## 2022-12-14 DIAGNOSIS — N6331 Unspecified lump in axillary tail of the right breast: Secondary | ICD-10-CM

## 2023-01-01 ENCOUNTER — Ambulatory Visit
Admission: RE | Admit: 2023-01-01 | Discharge: 2023-01-01 | Disposition: A | Discharge status: Home / Self Care | Payer: BC Managed Care – PPO | Source: Ambulatory Visit | Attending: Obstetrics & Gynecology | Admitting: Obstetrics & Gynecology

## 2023-01-01 ENCOUNTER — Other Ambulatory Visit: Payer: Self-pay | Admitting: Obstetrics & Gynecology

## 2023-01-01 DIAGNOSIS — R599 Enlarged lymph nodes, unspecified: Secondary | ICD-10-CM

## 2023-01-01 DIAGNOSIS — R928 Other abnormal and inconclusive findings on diagnostic imaging of breast: Secondary | ICD-10-CM

## 2023-01-03 ENCOUNTER — Other Ambulatory Visit: Payer: Self-pay | Admitting: Obstetrics & Gynecology

## 2023-01-03 ENCOUNTER — Ambulatory Visit
Admission: RE | Admit: 2023-01-03 | Discharge: 2023-01-03 | Disposition: A | Discharge status: Home / Self Care | Payer: BC Managed Care – PPO | Source: Ambulatory Visit | Attending: Obstetrics & Gynecology | Admitting: Obstetrics & Gynecology

## 2023-01-03 DIAGNOSIS — R928 Other abnormal and inconclusive findings on diagnostic imaging of breast: Secondary | ICD-10-CM

## 2023-01-03 DIAGNOSIS — R599 Enlarged lymph nodes, unspecified: Secondary | ICD-10-CM

## 2023-01-08 ENCOUNTER — Other Ambulatory Visit: Payer: BC Managed Care – PPO

## 2023-01-16 ENCOUNTER — Encounter: Payer: Self-pay | Admitting: Internal Medicine

## 2023-01-16 ENCOUNTER — Telehealth: Payer: Self-pay

## 2023-01-16 NOTE — Telephone Encounter (Signed)
Left message for pt to call office back regarding appt needs on 10/3. Nurse is asking what appt is needed for.

## 2023-01-21 ENCOUNTER — Encounter: Payer: Self-pay | Admitting: Internal Medicine

## 2023-02-01 ENCOUNTER — Telehealth: Payer: Self-pay

## 2023-02-01 NOTE — Telephone Encounter (Signed)
Multiple attempts made to reach patient- No answer-unable to leave message; unable to reach patient at end of dayPV and procedure appts cancelled and a no show letter sent to the patient;

## 2023-02-07 ENCOUNTER — Ambulatory Visit: Payer: BC Managed Care – PPO | Admitting: Obstetrics & Gynecology

## 2023-02-12 ENCOUNTER — Encounter: Payer: BC Managed Care – PPO | Admitting: Internal Medicine

## 2023-02-14 ENCOUNTER — Ambulatory Visit: Payer: BC Managed Care – PPO | Admitting: *Deleted

## 2023-02-14 VITALS — Ht 60.0 in | Wt 145.0 lb

## 2023-02-14 DIAGNOSIS — Z1211 Encounter for screening for malignant neoplasm of colon: Secondary | ICD-10-CM

## 2023-02-14 MED ORDER — NA SULFATE-K SULFATE-MG SULF 17.5-3.13-1.6 GM/177ML PO SOLN
1.0000 | Freq: Once | ORAL | 0 refills | Status: AC
Start: 2023-02-14 — End: 2023-02-14

## 2023-02-14 NOTE — Progress Notes (Signed)
Pt's name and DOB verified at the beginning of the pre-visit wit 2 identifiers  Pt denies any difficulty with ambulating,sitting, laying down or rolling side to side  Gave both [facility] main # and MD on call # prior to instructions.   No egg or soy allergy known to patient   No issues known to pt with past sedation with any surgeries or procedures  Pt denies having issues being intubated  Pt has no issues moving head neck or swallowing  No FH of Malignant Hyperthermia  Pt is not on diet pills or shots  Pt is not on home 02   Pt is not on blood thinners   Pt denies issues with constipation   Pt is not on dialysis  Pt denise any abnormal heart rhythms   Pt denies any upcoming cardiac testing  Pt encouraged to use to use Singlecare or Goodrx to reduce cost   Patient's chart reviewed by Cathlyn Parsons CNRA prior to pre-visit and patient appropriate for the [facility].  Pre-visit completed and red dot placed by patient's name on their procedure day (on provider's schedule).  .  Visit by phone  Pt states weight is 145 lb  Instructed pt why it is important to and  to call if they have any changes in health or new medications. Directed them to the # given and on instructions.     Instructions reviewed with pt and pt states understanding. Instructed to review again prior to procedure. Pt states they will.   Instructions sent by mail with coupon and by my chart

## 2023-02-26 ENCOUNTER — Encounter: Payer: Self-pay | Admitting: Internal Medicine

## 2023-02-26 ENCOUNTER — Ambulatory Visit: Payer: BC Managed Care – PPO | Admitting: Internal Medicine

## 2023-02-26 VITALS — BP 94/60 | HR 70 | Temp 98.6°F | Resp 16 | Ht 60.0 in | Wt 145.0 lb

## 2023-02-26 DIAGNOSIS — Z1211 Encounter for screening for malignant neoplasm of colon: Secondary | ICD-10-CM

## 2023-02-26 DIAGNOSIS — D120 Benign neoplasm of cecum: Secondary | ICD-10-CM

## 2023-02-26 MED ORDER — SODIUM CHLORIDE 0.9 % IV SOLN: 500.0000 mL | INTRAVENOUS | Status: DC

## 2023-02-26 NOTE — Progress Notes (Signed)
Called to room to assist during endoscopic procedure.  Patient ID and intended procedure confirmed with present staff. Received instructions for my participation in the procedure from the performing physician.  

## 2023-02-26 NOTE — Op Note (Signed)
[facility] Patient Name: Lisa Landry Procedure Date: 02/26/2023 2:28 PM MRN: [id number] Endoscopist: Madelyn Brunner [place] , , [id number] Age: 47 Referring MD:  Date of Birth: 18-Aug-1975 Gender: Female Account #: [id number] Procedure:                Colonoscopy Indications:              Screening for colorectal malignant neoplasm, This                            is the patient's first colonoscopy Medicines:                Monitored Anesthesia Care Procedure:                Pre-Anesthesia Assessment:                           - Prior to the procedure, a History and Physical                            was performed, and patient medications and                            allergies were reviewed. The patient's tolerance of                            previous anesthesia was also reviewed. The risks                            and benefits of the procedure and the sedation                            options and risks were discussed with the patient.                            All questions were answered, and informed consent                            was obtained. Prior Anticoagulants: The patient has                            taken no anticoagulant or antiplatelet agents. ASA                            Grade Assessment: II - A patient with mild systemic                            disease. After reviewing the risks and benefits,                            the patient was deemed in satisfactory condition to                            undergo the procedure.  After obtaining informed consent, the colonoscope                            was passed under direct vision. Throughout the                            procedure, the patient's blood pressure, pulse, and                            oxygen saturations were monitored continuously. The                            CF [id number] #[id number] was introduced through the anus                            and advanced  to the the terminal ileum. The                            colonoscopy was performed without difficulty. The                            patient tolerated the procedure well. The quality                            of the bowel preparation was good. The terminal                            ileum, ileocecal valve, appendiceal orifice, and                            rectum were photographed. Scope In: 2:36:06 PM Scope Out: 2:50:12 PM Scope Withdrawal Time: 0 hours 9 minutes 58 seconds  Total Procedure Duration: 0 hours 14 minutes 6 seconds  Findings:                 The terminal ileum appeared normal.                           A 4 mm polyp was found in the cecum. The polyp was                            sessile. The polyp was removed with a cold snare.                            Resection and retrieval were complete.                           Non-bleeding internal hemorrhoids were found during                            retroflexion. Complications:            No immediate complications. Estimated Blood Loss:     Estimated blood loss was minimal. Impression:               -  The examined portion of the ileum was normal.                           - One 4 mm polyp in the cecum, removed with a cold                            snare. Resected and retrieved.                           - Non-bleeding internal hemorrhoids. Recommendation:           - Discharge patient to home (with escort).                           - Await pathology results.                           - The findings and recommendations were discussed                            with the patient. Dr Particia Lather "Alan Ripper" [place],  02/26/2023 2:53:12 PM

## 2023-02-26 NOTE — Progress Notes (Signed)
Sedate, gd SR, tolerated procedure well, VSS, report to RN 

## 2023-02-26 NOTE — Progress Notes (Signed)
Pt's states no medical or surgical changes since previsit or office visit. 

## 2023-02-26 NOTE — Patient Instructions (Signed)
-  Handout on polyps, hemorrhoids provided -await pathology results -repeat colonoscopy for surveillance recommended. Date to be determined when pathology result become available   -Continue present medications    YOU HAD AN ENDOSCOPIC PROCEDURE TODAY AT THE [facility]:   Refer to the procedure report that was given to you for any specific questions about what was found during the examination.  If the procedure report does not answer your questions, please call your gastroenterologist to clarify.  If you requested that your care partner not be given the details of your procedure findings, then the procedure report has been included in a sealed envelope for you to review at your convenience later.  YOU SHOULD EXPECT: Some feelings of bloating in the abdomen. Passage of more gas than usual.  Walking can help get rid of the air that was put into your GI tract during the procedure and reduce the bloating. If you had a lower endoscopy (such as a colonoscopy or flexible sigmoidoscopy) you may notice spotting of blood in your stool or on the toilet paper. If you underwent a bowel prep for your procedure, you may not have a normal bowel movement for a few days.  Please Note:  You might notice some irritation and congestion in your nose or some drainage.  This is from the oxygen used during your procedure.  There is no need for concern and it should clear up in a day or so.  SYMPTOMS TO REPORT IMMEDIATELY:  Following lower endoscopy (colonoscopy or flexible sigmoidoscopy):  Excessive amounts of blood in the stool  Significant tenderness or worsening of abdominal pains  Swelling of the abdomen that is new, acute  Fever of 100F or higher  For urgent or emergent issues, a gastroenterologist can be reached at any hour by calling [phone]. Do not use MyChart messaging for urgent concerns.    DIET:  We do recommend a small meal at first, but then you may proceed to your regular diet.   Drink plenty of fluids but you should avoid alcoholic beverages for 24 hours.  ACTIVITY:  You should plan to take it easy for the rest of today and you should NOT DRIVE or use heavy machinery until tomorrow (because of the sedation medicines used during the test).    FOLLOW UP: Our staff will call the number listed on your records the next business day following your procedure.  We will call around 7:15- 8:00 am to check on you and address any questions or concerns that you may have regarding the information given to you following your procedure. If we do not reach you, we will leave a message.     If any biopsies were taken you will be contacted by phone or by letter within the next 1-3 weeks.  Please call us at [phone] if you have not heard about the biopsies in 3 weeks.    SIGNATURES/CONFIDENTIALITY: You and/or your care partner have signed paperwork which will be entered into your electronic medical record.  These signatures attest to the fact that that the information above on your After Visit Summary has been reviewed and is understood.  Full responsibility of the confidentiality of this discharge information lies with you and/or your care-partner.  

## 2023-02-26 NOTE — Progress Notes (Signed)
GASTROENTEROLOGY PROCEDURE H&P NOTE   Primary Care Physician: [facility]    Reason for Procedure:   Colon cancer screening  Plan:    Colonoscopy  Patient is appropriate for endoscopic procedure(s) in the ambulatory ([facility]) setting.  The nature of the procedure, as well as the risks, benefits, and alternatives were carefully and thoroughly reviewed with the patient. Ample time for discussion and questions allowed. The patient understood, was satisfied, and agreed to proceed.     HPI: Lisa Landry is a 47 y.o. female who presents for colonoscopy for colon cancer screening. Denies blood in stools, changes in bowel habits, or unintentional weight loss. Denies family history of colon cancer.  Past Medical History:  Diagnosis Date   Abnormal Pap smear    Allergy    Anemia    With Pregancy   Trichomonal vaginitis 08/07/2022    Past Surgical History:  Procedure Laterality Date   BIOPSY BREAST Right    COLPOSCOPY     abnormal pap.   TUBAL LIGATION Bilateral 12/15/2016   Procedure: POST PARTUM TUBAL LIGATION WITH FILSHIE CLIPS;  Surgeon: Reva Bores, MD;  Location: [facility];  Service: Gynecology;  Laterality: Bilateral;   WISDOM TOOTH EXTRACTION     x2    Prior to Admission medications   Medication Sig Start Date End Date Taking? Authorizing Provider  metroNIDAZOLE Benzoate (FIRST-METRONIDAZOLE) 50 MG/ML SUSR TAKE BY MOUTH TWICE DAILY 09/24/22   [provider]    Current Outpatient Medications  Medication Sig Dispense Refill   metroNIDAZOLE Benzoate (FIRST-METRONIDAZOLE) 50 MG/ML SUSR TAKE BY MOUTH TWICE DAILY     Current Facility-Administered Medications  Medication Dose Route Frequency Provider Last Rate Last Admin   0.9 %  sodium chloride infusion  500 mL Intravenous Continuous Imogene Burn, MD        Allergies as of 02/26/2023 - Review Complete 02/26/2023  Allergen Reaction Noted   Amoxil [amoxicillin] Hives 05/30/2016     Family History  Problem Relation Age of Onset   Hypothyroidism Mother    Hypothyroidism Maternal Aunt    Pancreatic cancer Maternal Uncle    Pancreatic cancer Maternal Uncle    Breast cancer Neg Hx    Colon cancer Neg Hx    Colon polyps Neg Hx    Esophageal cancer Neg Hx    Rectal cancer Neg Hx    Stomach cancer Neg Hx     Social History   Socioeconomic History   Marital status: Single    Spouse name: Not on file   Number of children: Not on file   Years of education: Not on file   Highest education level: Not on file  Occupational History   Not on file  Tobacco Use   Smoking status: Never   Smokeless tobacco: Never  Substance and Sexual Activity   Alcohol use: No   Drug use: No   Sexual activity: Yes    Birth control/protection: None  Other Topics Concern   Not on file  Social History Narrative   Not on file   Social Determinants of Health   Financial Resource Strain: Not on file  Food Insecurity: Not on file  Transportation Needs: Not on file  Physical Activity: Not on file  Stress: Not on file  Social Connections: Not on file  Intimate Partner Violence: Not on file    Physical Exam: Vital signs in last 24 hours: BP (!) 157/73   Pulse 73   Temp 98.6  F (37 C)   Ht 5' (1.524 m)   Wt 145 lb (65.8 kg)   SpO2 100%   BMI 28.32 kg/m  GEN: NAD EYE: Sclerae anicteric ENT: MMM CV: Non-tachycardic Pulm: No increased work of breathing GI: Soft, NT/ND NEURO:  Alert & Oriented   Eulah Pont, MD [facility]  02/26/2023 2:28 PM

## 2023-02-27 ENCOUNTER — Telehealth: Payer: Self-pay

## 2023-02-27 NOTE — Telephone Encounter (Signed)
  Follow up Call-     02/26/2023    1:47 PM  Call back number  Post procedure Call Back phone  # [phone]  Permission to leave phone message Yes     Patient questions:  Do you have a fever, pain , or abdominal swelling? No. Pain Score  0 *  Have you tolerated food without any problems? Yes.    Have you been able to return to your normal activities? Yes.    Do you have any questions about your discharge instructions: Diet   No. Medications  No. Follow up visit  No.  Do you have questions or concerns about your Care? No.  Actions: * If pain score is 4 or above: No action needed, pain <4.

## 2023-03-05 LAB — SURGICAL PATHOLOGY

## 2023-03-06 ENCOUNTER — Encounter: Payer: Self-pay | Admitting: Internal Medicine

## 2024-02-24 ENCOUNTER — Encounter: Payer: Self-pay | Admitting: Obstetrics & Gynecology

## 2024-02-24 ENCOUNTER — Other Ambulatory Visit: Payer: Self-pay | Admitting: Obstetrics & Gynecology

## 2024-02-24 DIAGNOSIS — Z1231 Encounter for screening mammogram for malignant neoplasm of breast: Secondary | ICD-10-CM

## 2024-03-17 ENCOUNTER — Inpatient Hospital Stay
Admission: RE | Admit: 2024-03-17 | Discharge: 2024-03-17 | Payer: Self-pay | Attending: Obstetrics & Gynecology | Admitting: Obstetrics & Gynecology

## 2024-03-17 DIAGNOSIS — Z1231 Encounter for screening mammogram for malignant neoplasm of breast: Secondary | ICD-10-CM

## 2024-03-20 ENCOUNTER — Ambulatory Visit: Payer: Self-pay | Admitting: Obstetrics & Gynecology

## 2024-04-07 ENCOUNTER — Ambulatory Visit (INDEPENDENT_AMBULATORY_CARE_PROVIDER_SITE_OTHER): Payer: Self-pay | Admitting: Obstetrics & Gynecology

## 2024-04-07 ENCOUNTER — Encounter: Payer: Self-pay | Admitting: Obstetrics & Gynecology

## 2024-04-07 ENCOUNTER — Other Ambulatory Visit (HOSPITAL_COMMUNITY)
Admission: RE | Admit: 2024-04-07 | Discharge: 2024-04-07 | Disposition: A | Source: Ambulatory Visit | Attending: Obstetrics & Gynecology | Admitting: Obstetrics & Gynecology

## 2024-04-07 VITALS — BP 130/80 | HR 73 | Wt 149.2 lb

## 2024-04-07 DIAGNOSIS — Z113 Encounter for screening for infections with a predominantly sexual mode of transmission: Secondary | ICD-10-CM

## 2024-04-07 DIAGNOSIS — Z01419 Encounter for gynecological examination (general) (routine) without abnormal findings: Secondary | ICD-10-CM

## 2024-04-07 NOTE — Progress Notes (Addendum)
 GYNECOLOGY ANNUAL PREVENTATIVE CARE ENCOUNTER NOTE  History:    Lisa Landry is a 48 y.o. G32P3003 female here for a routine annual gynecologic exam.  Current complaints: none.  Occasional hot flashes, night sweats, not debilitatating.  Denies abnormal vaginal bleeding, discharge, pelvic pain, problems with intercourse or other gynecologic concerns.  Gynecologic History No LMP recorded (lmp unknown). (Menstrual status: Irregular Periods). Contraception: tubal ligation Last Pap: 01/17/2021. Result was ASCUS with negative HPV Last Mammogram: 03/31/2024.  Result was normal Last Colonoscopy: 02/26/2023.  Result was normal  Obstetric History OB History  Gravida Para Term Preterm AB Living  3 3 3  0 0 3  SAB IAB Ectopic Multiple Live Births  0 0 0 0 3    # Outcome Date GA Lbr Len/2nd Weight Sex Type Anes PTL Lv  3 Term 12/15/16 [redacted]w[redacted]d 01:20 / 00:07 6 lb 7.5 oz (2.935 kg) M Vag-Spont None  LIV  2 Term 02/01/12 [redacted]w[redacted]d 00:44 / 00:06 6 lb 14.1 oz (3.12 kg) F Vag-Spont None  LIV     Birth Comments: 48 year old 2nd pregnancy 63 year old sister  No problems during pregnancy other than vitamins Non smoker No alcohol  Spontaneous ROM--12 hours before delivery. Vaginal delivery Home after 1 day   1 Term 1998 [redacted]w[redacted]d   F Vag-Spont None  LIV    Past Medical History:  Diagnosis Date   Abnormal Pap smear    Allergy    Anemia    With Pregancy   Trichomonal vaginitis 08/07/2022    Past Surgical History:  Procedure Laterality Date   BIOPSY BREAST Right    COLPOSCOPY     abnormal pap.   TUBAL LIGATION Bilateral 12/15/2016   Procedure: POST PARTUM TUBAL LIGATION WITH FILSHIE CLIPS;  Surgeon: Fredirick Glenys RAMAN, MD;  Location: [facility];  Service: Gynecology;  Laterality: Bilateral;   WISDOM TOOTH EXTRACTION     x2    Current Outpatient Medications on File Prior to Visit  Medication Sig Dispense Refill   metroNIDAZOLE  Benzoate (FIRST-METRONIDAZOLE ) 50 MG/ML SUSR TAKE 10ML BY MOUTH TWICE  DAILY (Patient not taking: Reported on 04/07/2024)     No current facility-administered medications on file prior to visit.    Allergies  Allergen Reactions   Amoxil [Amoxicillin] Hives    Has patient had a PCN reaction causing immediate rash, facial/tongue/throat swelling, SOB or lightheadedness with hypotension: Unknown Has patient had a PCN reaction causing severe rash involving mucus membranes or skin necrosis: No Has patient had a PCN reaction that required hospitalization: No Has patient had a PCN reaction occurring within the last 10 years: Yes If all of the above answers are NO, then may proceed with Cephalosporin use.      Social History:  reports that she has never smoked. She has never used smokeless tobacco. She reports that she does not drink alcohol and does not use drugs.  Family History  Problem Relation Age of Onset   Hypothyroidism Mother    Hypothyroidism Maternal Aunt    Pancreatic cancer Maternal Uncle    Pancreatic cancer Maternal Uncle    Breast cancer Neg Hx    Colon cancer Neg Hx    Colon polyps Neg Hx    Esophageal cancer Neg Hx    Rectal cancer Neg Hx    Stomach cancer Neg Hx     The following portions of the patient's history were reviewed and updated as appropriate: allergies, current medications, past family history, past medical history, past social  history, past surgical history and problem list.  Review of Systems Pertinent items noted in HPI and remainder of comprehensive ROS otherwise negative.  Physical Exam: Chaperone Neva  Hyler, CMA  BP 130/80   Pulse 73   Wt 149 lb 3.2 oz (67.7 kg)   LMP  (LMP Unknown)   BMI 29.14 kg/m  CONSTITUTIONAL: Well-developed, well-nourished female in no acute distress.  HENT:  Normocephalic, atraumatic, External right and left ear normal.  EYES: Conjunctivae and EOM are normal. Pupils are equal, round, and reactive to light. No scleral icterus.  NECK: Normal range of motion, supple, no masses  observed. SKIN: Skin is warm and dry. No rash noted. Not diaphoretic. No erythema. No pallor. MUSCULOSKELETAL: Normal range of motion. No tenderness.  No cyanosis, clubbing, or edema. NEUROLOGIC: Alert and oriented to person, place, and time. Normal muscle tone coordination.  PSYCHIATRIC: Normal mood and affect. Normal behavior. Normal judgment and thought content. CARDIOVASCULAR: Normal heart rate noted, regular rhythm RESPIRATORY: Clear to auscultation bilaterally. Effort and breath sounds normal, no problems with respiration noted. BREASTS: Deferred ABDOMEN: Soft, no distention noted.  No tenderness, rebound or guarding.  PELVIC: Normal appearing external genitalia and urethral meatus; normal appearing vaginal mucosa and cervix.  No abnormal vaginal discharge noted.  Pap smear obtained.  Normal uterine size, no other palpable masses, no uterine or adnexal tenderness.  Performed in the presence of a chaperone.  Assessment and Plan:     1. Routine screening for STI (sexually transmitted infection) STI requested and done, will follow up results and manage accordingly. - Cytology - PAP - RPR+HBsAg+HCVAb+HIV  2. Well woman exam with routine gynecological exam (Primary) - Cytology - PAP Will follow up results of pap smear and manage accordingly. Normal breast examination today, she was advised to perform periodic self breast examinations.  Mammogram  and colon cancer screening up to date. Referred to [email or web address] for information about perimenopause, encouraged lifestyle modifications. Routine preventative health maintenance measures emphasized. Please refer to After Visit Summary for other counseling recommendations.      GLORIS HUGGER, MD, FACOG Obstetrician & Gynecologist, [facility] for [employer], [facility]

## 2024-04-08 ENCOUNTER — Ambulatory Visit: Payer: Self-pay | Admitting: Obstetrics & Gynecology

## 2024-04-08 LAB — RPR+HBSAG+HCVAB+...
HIV Screen 4th Generation wRfx: NONREACTIVE
Hep C Virus Ab: NONREACTIVE
Hepatitis B Surface Ag: NEGATIVE
RPR Ser Ql: NONREACTIVE

## 2024-04-13 LAB — CYTOLOGY - PAP
Chlamydia: NEGATIVE
Comment: NEGATIVE
Comment: NEGATIVE
Comment: NEGATIVE
Comment: NORMAL
Diagnosis: UNDETERMINED — AB
High risk HPV: NEGATIVE
Neisseria Gonorrhea: NEGATIVE
Trichomonas: NEGATIVE
